# Patient Record
Sex: Female | Born: 1945 | Race: White | Hispanic: No | Smoking: Current some day smoker
Health system: Southern US, Community
[De-identification: ages and names within clinical notes are randomized; demographics above are authoritative.]

## PROBLEM LIST (undated history)

## (undated) ENCOUNTER — Inpatient Hospital Stay (HOSPITAL_COMMUNITY): Payer: Self-pay

## (undated) DIAGNOSIS — E039 Hypothyroidism, unspecified: Secondary | ICD-10-CM

## (undated) DIAGNOSIS — J189 Pneumonia, unspecified organism: Secondary | ICD-10-CM

## (undated) DIAGNOSIS — Z9889 Other specified postprocedural states: Secondary | ICD-10-CM

## (undated) DIAGNOSIS — T8859XA Other complications of anesthesia, initial encounter: Secondary | ICD-10-CM

## (undated) DIAGNOSIS — C801 Malignant (primary) neoplasm, unspecified: Secondary | ICD-10-CM

## (undated) DIAGNOSIS — R112 Nausea with vomiting, unspecified: Secondary | ICD-10-CM

## (undated) DIAGNOSIS — T4145XA Adverse effect of unspecified anesthetic, initial encounter: Secondary | ICD-10-CM

## (undated) HISTORY — PX: TONSILLECTOMY: SUR1361

---
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
## 2001-04-30 ENCOUNTER — Other Ambulatory Visit: Admission: RE | Admit: 2001-04-30 | Discharge: 2001-04-30 | Payer: Self-pay | Admitting: Family Medicine

## 2001-07-02 ENCOUNTER — Encounter: Admission: RE | Admit: 2001-07-02 | Discharge: 2001-07-02 | Payer: Self-pay | Admitting: Family Medicine

## 2001-07-02 ENCOUNTER — Encounter: Payer: Self-pay | Admitting: Family Medicine

## 2001-09-06 ENCOUNTER — Encounter (INDEPENDENT_AMBULATORY_CARE_PROVIDER_SITE_OTHER): Payer: Self-pay | Admitting: Specialist

## 2001-09-06 ENCOUNTER — Ambulatory Visit (HOSPITAL_COMMUNITY): Admission: RE | Admit: 2001-09-06 | Discharge: 2001-09-06 | Payer: Self-pay | Admitting: Gastroenterology

## 2002-05-12 ENCOUNTER — Other Ambulatory Visit: Admission: RE | Admit: 2002-05-12 | Discharge: 2002-05-12 | Payer: Self-pay | Admitting: Family Medicine

## 2002-07-07 ENCOUNTER — Encounter: Admission: RE | Admit: 2002-07-07 | Discharge: 2002-07-07 | Payer: Self-pay | Admitting: Family Medicine

## 2002-07-07 ENCOUNTER — Encounter: Payer: Self-pay | Admitting: Family Medicine

## 2003-07-09 ENCOUNTER — Encounter: Admission: RE | Admit: 2003-07-09 | Discharge: 2003-07-09 | Payer: Self-pay | Admitting: Internal Medicine

## 2003-11-06 ENCOUNTER — Encounter: Admission: RE | Admit: 2003-11-06 | Discharge: 2003-11-06 | Payer: Self-pay | Admitting: Internal Medicine

## 2004-04-15 ENCOUNTER — Encounter: Admission: RE | Admit: 2004-04-15 | Discharge: 2004-04-15 | Payer: Self-pay | Admitting: Internal Medicine

## 2004-07-13 ENCOUNTER — Encounter: Admission: RE | Admit: 2004-07-13 | Discharge: 2004-07-13 | Payer: Self-pay | Admitting: Internal Medicine

## 2004-09-08 ENCOUNTER — Ambulatory Visit (HOSPITAL_COMMUNITY): Admission: RE | Admit: 2004-09-08 | Discharge: 2004-09-08 | Payer: Self-pay | Admitting: Gastroenterology

## 2004-09-08 ENCOUNTER — Encounter (INDEPENDENT_AMBULATORY_CARE_PROVIDER_SITE_OTHER): Payer: Self-pay | Admitting: *Deleted

## 2005-07-25 ENCOUNTER — Encounter: Admission: RE | Admit: 2005-07-25 | Discharge: 2005-07-25 | Payer: Self-pay | Admitting: Family Medicine

## 2006-07-24 ENCOUNTER — Other Ambulatory Visit: Admission: RE | Admit: 2006-07-24 | Discharge: 2006-07-24 | Payer: Self-pay | Admitting: Family Medicine

## 2006-09-12 ENCOUNTER — Encounter: Admission: RE | Admit: 2006-09-12 | Discharge: 2006-09-12 | Payer: Self-pay | Admitting: Family Medicine

## 2007-09-13 ENCOUNTER — Encounter: Admission: RE | Admit: 2007-09-13 | Discharge: 2007-09-13 | Payer: Self-pay | Admitting: Family Medicine

## 2007-09-16 ENCOUNTER — Other Ambulatory Visit: Admission: RE | Admit: 2007-09-16 | Discharge: 2007-09-16 | Payer: Self-pay | Admitting: Obstetrics and Gynecology

## 2008-09-22 ENCOUNTER — Other Ambulatory Visit: Admission: RE | Admit: 2008-09-22 | Discharge: 2008-09-22 | Payer: Self-pay | Admitting: Obstetrics and Gynecology

## 2008-09-24 ENCOUNTER — Encounter: Admission: RE | Admit: 2008-09-24 | Discharge: 2008-09-24 | Payer: Self-pay | Admitting: Family Medicine

## 2009-09-27 ENCOUNTER — Encounter: Admission: RE | Admit: 2009-09-27 | Discharge: 2009-09-27 | Payer: Self-pay | Admitting: Obstetrics and Gynecology

## 2009-10-07 ENCOUNTER — Other Ambulatory Visit: Admission: RE | Admit: 2009-10-07 | Discharge: 2009-10-07 | Payer: Self-pay | Admitting: Obstetrics and Gynecology

## 2010-04-25 ENCOUNTER — Encounter: Payer: Self-pay | Admitting: Family Medicine

## 2010-08-19 NOTE — Op Note (Signed)
NAMEAMYLIA, Tammy Lawrence            ACCOUNT NO.:  [id number]   MEDICAL RECORD NO.:  [id number]          PATIENT TYPE:  AMB   LOCATION:  ENDO                         FACILITY:  [facility]   PHYSICIAN:  Bernette Redbird, M.D.   DATE OF BIRTH:  12/25/1945   DATE OF PROCEDURE:  09/08/2004  DATE OF DISCHARGE:                                 OPERATIVE REPORT   PROCEDURE:  Colonoscopy with biopsy.   INDICATIONS:  A 65 year old female whose son developed colon cancer some  years ago at age 39. The patient herself had one or two small colonic  adenomas removed colonoscopically about three years ago.   FINDINGS:  Two small polyps removed.   DESCRIPTION OF PROCEDURE:  The nature, purpose and risks of the procedure  were familiar to the patient from prior examination. She provided written  consent. Sedation was fentanyl 75 mcg and Versed 6 mg IV without arrhythmias  or desaturation. The Olympus adjustable tension pediatric video colonoscope  was advanced through a slightly fixated sigmoid region to the cecum, using  some external abdominal compression to enter the base of the cecum. The  cecum was identified by visualization of appendiceal orifice and  identification of the ileal cecal valve, although the terminal ileum was not  entered.   There was a tiny 2 mm bump on the appendiceal orifice, possibly representing  an early polyp, and a slightly larger small sessile polyp measuring about 3  mm across in the proximal ascending colon, removed by a single cold biopsy.  No other polyps were seen and there was no evidence of cancer, colitis,  vascular malformations or extensive diverticulosis; there did appear to be a  small amount of sigmoid diverticulosis.   Retroflexion in the rectum and reinspection of the rectum were unremarkable.   The patient tolerated the procedure well and there were no apparent  complications.   IMPRESSION:  1.  Two diminutive colon polyps removed as described above  (211.3).  2.  Mild diverticulosis.  3.  Family history of colon cancer.   PLAN:  Await pathology results with anticipated colonoscopic followup in  three to five 5 years depending on the histologic findings.       RB/MEDQ  D:  09/08/2004  T:  09/08/2004  Job:  [id number]   cc:   Allena Napoleon  [address]  [place]  [place] [address]  Fax: [phone]

## 2010-08-19 NOTE — Op Note (Signed)
[place] [employer]  Patient:    Tammy Lawrence, [facility] Visit Number: [id number] MRN: [id number]          Service Type: END Location: ENDO Attending Physician:  Rich Brave Dictated by:   Florencia Reasons, M.D. Proc. Date: 09/06/01 Admit Date:  09/06/2001 Discharge Date: 09/06/2001   CC:         Montey Hora, M.D.   Operative Report  PROCEDURE PERFORMED:  Colonoscopy with biopsy.  ENDOSCOPIST:  Florencia Reasons, M.D.  INDICATIONS FOR PROCEDURE:  The patient is a 65 year old female with a family history of colon cancer in her son, who was diagnosed around age 38.  FINDINGS:  Two diminutive polyps.  DESCRIPTION OF PROCEDURE:  The nature, purpose and risks of the procedure had been discussed with the patient, who provided written consent.  Sedation was fentanyl 70 mcg and Versed 7 mg IV without arrhythmias or desaturation. The Olympus standard pediatric video colonoscope was advanced quite to the cecum, having to turn the patient into the supine, right lateral decubitus and ultimately left lateral decubitus position with external abdominal compression to get the tip of the scope into the base of the cecum, which was successfully accomplished and pullback was then performed.  The quality of the prep was excellent and it is felt that all areas were well seen.  There were two small (2 to 3 mm) sessile polyps in the descending colon, each removed by  one or two cold biopsies.  No large polyps, cancer, colitis, vascular malformations or diverticular disease were observed.  Retroflexion in the rectum as well as reinspection of the rectosigmoid was unremarkable.  The patient tolerated the procedure well and there were no apparent complications.  IMPRESSION:  Two small colon polyps removed as described above.  Otherwise normal exam.  PLAN:  Await pathology on polyps. Dictated by:   Florencia Reasons, M.D. Attending Physician:  Rich Brave DD:  09/06/01 TD:  09/09/01 Job: [id number]

## 2010-09-05 ENCOUNTER — Other Ambulatory Visit: Payer: Self-pay | Admitting: Family Medicine

## 2010-09-05 DIAGNOSIS — Z1231 Encounter for screening mammogram for malignant neoplasm of breast: Secondary | ICD-10-CM

## 2010-09-29 ENCOUNTER — Ambulatory Visit
Admission: RE | Admit: 2010-09-29 | Discharge: 2010-09-29 | Disposition: A | Discharge status: Home / Self Care | Payer: BC Managed Care – PPO | Source: Ambulatory Visit | Attending: Family Medicine | Admitting: Family Medicine

## 2010-09-29 DIAGNOSIS — Z1231 Encounter for screening mammogram for malignant neoplasm of breast: Secondary | ICD-10-CM

## 2010-12-08 ENCOUNTER — Other Ambulatory Visit (HOSPITAL_COMMUNITY)
Admission: RE | Admit: 2010-12-08 | Discharge: 2010-12-08 | Disposition: A | Discharge status: Home / Self Care | Payer: BC Managed Care – PPO | Source: Ambulatory Visit | Attending: Family Medicine | Admitting: Family Medicine

## 2010-12-08 ENCOUNTER — Other Ambulatory Visit: Payer: Self-pay | Admitting: Family Medicine

## 2010-12-08 DIAGNOSIS — Z124 Encounter for screening for malignant neoplasm of cervix: Secondary | ICD-10-CM | POA: Insufficient documentation

## 2010-12-08 DIAGNOSIS — R87810 Cervical high risk human papillomavirus (HPV) DNA test positive: Secondary | ICD-10-CM | POA: Insufficient documentation

## 2011-08-23 ENCOUNTER — Other Ambulatory Visit: Payer: Self-pay | Admitting: Family Medicine

## 2011-08-23 DIAGNOSIS — Z1231 Encounter for screening mammogram for malignant neoplasm of breast: Secondary | ICD-10-CM

## 2011-10-09 ENCOUNTER — Ambulatory Visit
Admission: RE | Admit: 2011-10-09 | Discharge: 2011-10-09 | Disposition: A | Discharge status: Home / Self Care | Payer: BC Managed Care – PPO | Source: Ambulatory Visit | Attending: Family Medicine | Admitting: Family Medicine

## 2011-10-09 DIAGNOSIS — Z1231 Encounter for screening mammogram for malignant neoplasm of breast: Secondary | ICD-10-CM

## 2011-12-11 ENCOUNTER — Other Ambulatory Visit: Payer: Self-pay | Admitting: Obstetrics and Gynecology

## 2011-12-11 ENCOUNTER — Other Ambulatory Visit (HOSPITAL_COMMUNITY)
Admission: RE | Admit: 2011-12-11 | Discharge: 2011-12-11 | Disposition: A | Discharge status: Home / Self Care | Payer: Medicare Other | Source: Ambulatory Visit | Attending: Obstetrics and Gynecology | Admitting: Obstetrics and Gynecology

## 2011-12-11 DIAGNOSIS — Z01419 Encounter for gynecological examination (general) (routine) without abnormal findings: Secondary | ICD-10-CM | POA: Insufficient documentation

## 2012-09-27 ENCOUNTER — Other Ambulatory Visit: Payer: Self-pay

## 2012-09-27 DIAGNOSIS — Z1231 Encounter for screening mammogram for malignant neoplasm of breast: Secondary | ICD-10-CM

## 2012-10-10 ENCOUNTER — Ambulatory Visit
Admission: RE | Admit: 2012-10-10 | Discharge: 2012-10-10 | Disposition: A | Discharge status: Home / Self Care | Payer: Medicare PPO | Source: Ambulatory Visit

## 2012-10-10 DIAGNOSIS — Z1231 Encounter for screening mammogram for malignant neoplasm of breast: Secondary | ICD-10-CM

## 2012-10-17 ENCOUNTER — Ambulatory Visit: Payer: BC Managed Care – PPO

## 2012-12-11 ENCOUNTER — Other Ambulatory Visit: Payer: Self-pay | Admitting: Obstetrics and Gynecology

## 2012-12-11 ENCOUNTER — Other Ambulatory Visit (HOSPITAL_COMMUNITY)
Admission: RE | Admit: 2012-12-11 | Discharge: 2012-12-11 | Disposition: A | Discharge status: Home / Self Care | Payer: Medicare PPO | Source: Ambulatory Visit | Attending: Obstetrics and Gynecology | Admitting: Obstetrics and Gynecology

## 2012-12-11 DIAGNOSIS — R87619 Unspecified abnormal cytological findings in specimens from cervix uteri: Secondary | ICD-10-CM | POA: Insufficient documentation

## 2012-12-11 DIAGNOSIS — R87810 Cervical high risk human papillomavirus (HPV) DNA test positive: Secondary | ICD-10-CM | POA: Insufficient documentation

## 2012-12-11 DIAGNOSIS — Z01419 Encounter for gynecological examination (general) (routine) without abnormal findings: Secondary | ICD-10-CM | POA: Insufficient documentation

## 2013-04-30 ENCOUNTER — Other Ambulatory Visit: Payer: Self-pay | Admitting: Gastroenterology

## 2013-04-30 DIAGNOSIS — R740 Nonspecific elevation of levels of transaminase and lactic acid dehydrogenase [LDH]: Principal | ICD-10-CM

## 2013-04-30 DIAGNOSIS — R7401 Elevation of levels of liver transaminase levels: Secondary | ICD-10-CM

## 2013-05-08 ENCOUNTER — Other Ambulatory Visit: Payer: Medicare PPO

## 2013-05-13 ENCOUNTER — Ambulatory Visit
Admission: RE | Admit: 2013-05-13 | Discharge: 2013-05-13 | Disposition: A | Discharge status: Home / Self Care | Payer: Medicare PPO | Source: Ambulatory Visit | Attending: Gastroenterology | Admitting: Gastroenterology

## 2013-05-13 DIAGNOSIS — R7402 Elevation of levels of lactic acid dehydrogenase (LDH): Secondary | ICD-10-CM

## 2013-05-13 DIAGNOSIS — R740 Nonspecific elevation of levels of transaminase and lactic acid dehydrogenase [LDH]: Principal | ICD-10-CM

## 2013-09-16 ENCOUNTER — Other Ambulatory Visit: Payer: Self-pay

## 2013-09-16 DIAGNOSIS — Z1231 Encounter for screening mammogram for malignant neoplasm of breast: Secondary | ICD-10-CM

## 2013-09-26 ENCOUNTER — Non-Acute Institutional Stay: Payer: Medicare PPO | Admitting: Internal Medicine

## 2013-09-26 DIAGNOSIS — E1149 Type 2 diabetes mellitus with other diabetic neurological complication: Secondary | ICD-10-CM

## 2013-09-26 DIAGNOSIS — G609 Hereditary and idiopathic neuropathy, unspecified: Secondary | ICD-10-CM

## 2013-09-26 DIAGNOSIS — I509 Heart failure, unspecified: Secondary | ICD-10-CM

## 2013-09-26 DIAGNOSIS — I10 Essential (primary) hypertension: Secondary | ICD-10-CM

## 2013-10-09 DIAGNOSIS — G609 Hereditary and idiopathic neuropathy, unspecified: Secondary | ICD-10-CM | POA: Insufficient documentation

## 2013-10-09 DIAGNOSIS — I509 Heart failure, unspecified: Secondary | ICD-10-CM | POA: Insufficient documentation

## 2013-10-09 DIAGNOSIS — I10 Essential (primary) hypertension: Secondary | ICD-10-CM | POA: Insufficient documentation

## 2013-10-09 DIAGNOSIS — E1149 Type 2 diabetes mellitus with other diabetic neurological complication: Secondary | ICD-10-CM | POA: Insufficient documentation

## 2013-10-09 NOTE — Progress Notes (Signed)
Patient ID: Tammy Lawrence, female   DOB: [birth year], 68 y.o.   MRN: [id number]                 HISTORY & PHYSICAL  DATE: 09/26/2013          FACILITY: [facility]  LEVEL OF CARE: SNF (31)  ALLERGIES:   Penicillins.    CHIEF COMPLAINT:  Manage diabetes mellitus, CHF, and hypertension.    HISTORY OF PRESENT ILLNESS:  68 year-old, African-American female admitted to this facility for short-term rehabilitation.    REASSESSMENT OF ONGOING PROBLEM(S):    DM:pt's DM remains stable.  Pt denies polyuria, polydipsia, polyphagia, changes in vision or hypoglycemic episodes.  Last hemoglobin A1c is:   Not available.    CHF:The patient does not relate significant weight changes, denies sob, DOE, orthopnea, PNDs, palpitations or chest pain.  Complains of  chronic lower extremity swelling.  CHF remains stable.  No complications form the medications being used.    HTN: Pt 's HTN remains stable.  Denies CP, sob, DOE, headaches, dizziness or visual disturbances.  No complications from the medications currently being used.  Last BP :   100/76.    PAST MEDICAL HISTORY :   Diabetes mellitus.    Hypertension.    Lymphedema.    Morbid obesity.    Hyperlipidemia.    Peripheral neuropathy.    CHF.    Sarcoidosis.    Gout.    Anemia.    PAST SURGICAL HISTORY:  Rotator cuff repair.    Right temporal lobe biopsy.    Right knee arthroscopy.     SOCIAL HISTORY: MARITAL HISTORY:  Patient is a widow.   TOBACCO USE:  She is a former smoker of one pack a day for 30 years, quit in 1985.   ALCOHOL:  Denies alcohol use.    ILLICIT DRUGS:  Denies illicit drug use.    FAMILY HISTORY:  MOTHER:  Mother had mental illness.   FATHER:  Father had COPD.   SIBLINGS:  Sister has diabetes.  Brother has cancer.   Brother also has CHF, diabetes mellitus, and hypertension.    CURRENT MEDICATIONS: Reviewed per MAR/see medication list  REVIEW OF SYSTEMS:  See HPI otherwise 14  point ROS is negative.  PHYSICAL EXAMINATION  VS:  T 98       P 78      RR 18      BP 100/76             GENERAL: no acute distress, morbidly obese body habitus EYES: conjunctivae normal, sclerae normal, normal eye lids MOUTH/THROAT: lips without lesions,no lesions in the mouth,tongue is without lesions,uvula elevates in midline NECK: supple, trachea midline, no neck masses, no thyroid tenderness, no thyromegaly LYMPHATICS: no LAN in the neck, no supraclavicular LAN RESPIRATORY: breathing is even & unlabored, BS CTAB CARDIAC: RRR, no murmur,no extra heart sounds, +3 bilateral lower extremity edema    GI:  ABDOMEN: abdomen soft, normal BS, no masses, no tenderness  LIVER/SPLEEN: no hepatomegaly, no splenomegaly MUSCULOSKELETAL: HEAD: normal to inspection  EXTREMITIES: LEFT UPPER EXTREMITY: full range of motion, normal strength & tone RIGHT UPPER EXTREMITY:  full range of motion, normal strength & tone LEFT LOWER EXTREMITY: strength decreased, range of motion minimal   RIGHT LOWER EXTREMITY: strength decreased, range of motion minimal   PSYCHIATRIC: the patient is alert & oriented to person, affect & behavior appropriate  LABS/RADIOLOGY:  On 09/25/2013:    PT 12.3,  INR 1.2.    CO2 20, BUN 25, glucose 142, otherwise BMP normal.    Magnesium 1.8.    On 09/24/2013:    WBC 11, hemoglobin 8, MCV 92.6, platelets 275.     ASSESSMENT/PLAN:   Diabetes mellitus.  Currently diet controlled.    CHF.  Compensated.    Hypertension.  Well controlled.     Peripheral neuropathy.  Continue Neurontin.     Lymphedema.  Continue supportive care.     Hyperlipidemia.  Continue Lipitor.    Constipation.  Continue MiraLAX.    Check CBC and BMP.    I have reviewed patient's medical records received at admission/from hospitalization.  CPT CODE: 81829           Gayani Y Dasanayaka, [place] [phone]

## 2013-10-09 NOTE — Progress Notes (Signed)
This encounter was created in error - please disregard.

## 2013-10-09 NOTE — Addendum Note (Signed)
Addended by: Merlene Laughter on: 10/09/2013 04:26 PM   Modules accepted: Level of Service, SmartSet

## 2013-10-14 ENCOUNTER — Ambulatory Visit
Admission: RE | Admit: 2013-10-14 | Discharge: 2013-10-14 | Disposition: A | Discharge status: Home / Self Care | Payer: Medicare PPO | Source: Ambulatory Visit

## 2013-10-14 DIAGNOSIS — Z1231 Encounter for screening mammogram for malignant neoplasm of breast: Secondary | ICD-10-CM

## 2013-11-19 ENCOUNTER — Non-Acute Institutional Stay: Payer: Medicare PPO | Admitting: Internal Medicine

## 2013-11-19 DIAGNOSIS — Z79899 Other long term (current) drug therapy: Secondary | ICD-10-CM

## 2013-11-19 DIAGNOSIS — D631 Anemia in chronic kidney disease: Secondary | ICD-10-CM

## 2013-11-19 DIAGNOSIS — N039 Chronic nephritic syndrome with unspecified morphologic changes: Secondary | ICD-10-CM

## 2013-11-19 DIAGNOSIS — R609 Edema, unspecified: Secondary | ICD-10-CM

## 2013-11-24 ENCOUNTER — Non-Acute Institutional Stay: Payer: Medicare PPO | Admitting: Internal Medicine

## 2013-11-24 DIAGNOSIS — N183 Chronic kidney disease, stage 3 unspecified: Secondary | ICD-10-CM

## 2013-11-24 DIAGNOSIS — D631 Anemia in chronic kidney disease: Secondary | ICD-10-CM | POA: Insufficient documentation

## 2013-11-24 DIAGNOSIS — Z79899 Other long term (current) drug therapy: Secondary | ICD-10-CM | POA: Insufficient documentation

## 2013-11-24 DIAGNOSIS — N039 Chronic nephritic syndrome with unspecified morphologic changes: Secondary | ICD-10-CM

## 2013-11-24 NOTE — Progress Notes (Signed)
Patient ID: Tammy Lawrence, female   DOB: 1946-03-03, 68 y.o.   MRN: [id number]           PROGRESS NOTE  DATE: 11/19/2013          FACILITY:  [facility]  LEVEL OF CARE: SNF (31)  Acute Visit  CHIEF COMPLAINT:  Manage increasing lower extremity swelling and anemia of chronic kidney disease.    HISTORY OF PRESENT ILLNESS: I was requested by the staff to assess the patient regarding above problem(s):  EDEMA: The patient's edema is unstable. Patient is complaining of increased lower extremity swelling, especially in the left lower extremity, with skin breakdown in her left great toe and drainage. The patient denies calf pain, chest pain or shortness of breath. No complications reported from the medications currently being used.  She has chronic lymphedema.  She is currently not on any diuretics.     ANEMIA: The anemia has been stable. The patient denies fatigue, melena or hematochezia. No complications from the medications currently being used.  On 11/18/2013:  Hemoglobin 8.1, MCV 91.  On 11/13/2013:  Hemoglobin 8.2.  The patient's edema is secondary to chronic kidney disease.    PAST MEDICAL HISTORY : Reviewed.  No changes/see problem list  CURRENT MEDICATIONS: Reviewed per MAR/see medication list  REVIEW OF SYSTEMS:  GENERAL: no change in appetite, no fatigue, no weight changes, no fever, chills or weakness RESPIRATORY: no cough, SOB, DOE,, wheezing, hemoptysis CARDIAC: no chest pain or palpitations;  chronic lower extremity swelling, complains of increasing left lower extremity swelling        GI: no abdominal pain, diarrhea, constipation, heart burn, nausea or vomiting  PHYSICAL EXAMINATION  VS: see VS section  GENERAL: no acute distress, morbidly obese body habitus NECK: supple, trachea midline, no neck masses, no thyroid tenderness, no thyromegaly RESPIRATORY: breathing is even & unlabored, BS CTAB CARDIAC: RRR, no murmur,no extra heart sounds, +3  left lower extremity edema, +2 right lower extremity edema        GI: abdomen soft, normal BS, no masses, no tenderness, no hepatomegaly, no splenomegaly PSYCHIATRIC: the patient is alert & oriented to person, affect & behavior appropriate  LABS/RADIOLOGY:  11/18/2013:  BMP normal.    ASSESSMENT/PLAN:  Increasing left lower extremity edema.  New problem.  Start Lasix 20 mg q.d.  Also, start KCl 10 mEq q.d.    Anemia of chronic kidney disease.  Stable.    V58.69.  Check BMP in two days.    CPT CODE: 37106            Chole Driver Y Amita Atayde, [place] [phone]

## 2013-11-25 DIAGNOSIS — N183 Chronic kidney disease, stage 3 unspecified: Secondary | ICD-10-CM | POA: Insufficient documentation

## 2013-11-25 NOTE — Addendum Note (Signed)
Addended by: Merlene Laughter on: 11/25/2013 08:01 PM   Modules accepted: Level of Service, SmartSet

## 2013-11-25 NOTE — Progress Notes (Signed)
This encounter was created in error - please disregard.

## 2013-11-25 NOTE — Addendum Note (Signed)
Addended by: Merlene Laughter on: 11/25/2013 07:57 PM   Modules accepted: Level of Service, SmartSet

## 2013-11-25 NOTE — Progress Notes (Signed)
Patient ID: Tammy Lawrence, female   DOB: Feb 13, 1946, 68 y.o.   MRN: [id number]           PROGRESS NOTE  DATE: 11/24/2013         FACILITY:  [facility]  LEVEL OF CARE: SNF (31)  Acute Visit  CHIEF COMPLAINT:  Manage chronic kidney disease stage III.    HISTORY OF PRESENT ILLNESS: I was requested by the staff to assess the patient regarding above problem(s):  CHRONIC KIDNEY DISEASE: The patient's chronic kidney disease is unstable.  Patient denies increasing lower extremity swelling or confusion. Last BUN and creatinine are:   On 11/21/2013:  BUN 19, creatinine 1.16.  In 10/2013:  Creatinine 0.98.  She has a history of chronic kidney disease stage III.  Patient was started on Lasix on 11/19/2013.    PAST MEDICAL HISTORY : Reviewed.  No changes/see problem list  CURRENT MEDICATIONS: Reviewed per MAR/see medication list  REVIEW OF SYSTEMS:  GENERAL: no change in appetite, no fatigue, no weight changes, no fever, chills or weakness RESPIRATORY: no cough, SOB, DOE,, wheezing, hemoptysis CARDIAC: no chest pain or palpitations;  chronic lower extremity swelling       GI: no abdominal pain, diarrhea, constipation, heart burn, nausea or vomiting  PHYSICAL EXAMINATION  VS: see VS section  GENERAL: no acute distress, morbidly obese body habitus NECK: supple, trachea midline, no neck masses, no thyroid tenderness, no thyromegaly RESPIRATORY: breathing is even & unlabored, BS CTAB CARDIAC: RRR, no murmur,no extra heart sounds, left lower extremity has +3 edema, right lower extremity has +2 edema        GI: abdomen soft, normal BS, no masses, no tenderness, no hepatomegaly, no splenomegaly PSYCHIATRIC: the patient is alert & oriented to person, affect & behavior appropriate  ASSESSMENT/PLAN:  Chronic kidney disease stage III.  Unstable problem.  Renal functions are worsening, likely secondary to Lasix.    We will recheck.    CPT CODE: 33825           Amrie Gurganus Y  Gavyn Ybarra, [place] [phone]

## 2013-12-23 ENCOUNTER — Other Ambulatory Visit: Payer: Self-pay | Admitting: Obstetrics and Gynecology

## 2013-12-23 ENCOUNTER — Other Ambulatory Visit (HOSPITAL_COMMUNITY)
Admission: RE | Admit: 2013-12-23 | Discharge: 2013-12-23 | Disposition: A | Discharge status: Home / Self Care | Payer: Medicare PPO | Source: Ambulatory Visit | Attending: Obstetrics and Gynecology | Admitting: Obstetrics and Gynecology

## 2013-12-23 DIAGNOSIS — Z124 Encounter for screening for malignant neoplasm of cervix: Secondary | ICD-10-CM | POA: Insufficient documentation

## 2013-12-23 DIAGNOSIS — Z1151 Encounter for screening for human papillomavirus (HPV): Secondary | ICD-10-CM | POA: Insufficient documentation

## 2013-12-23 DIAGNOSIS — R87810 Cervical high risk human papillomavirus (HPV) DNA test positive: Secondary | ICD-10-CM | POA: Diagnosis present

## 2013-12-25 LAB — CYTOLOGY - PAP

## 2014-02-23 ENCOUNTER — Other Ambulatory Visit: Payer: Self-pay | Admitting: Obstetrics and Gynecology

## 2014-09-18 DIAGNOSIS — J069 Acute upper respiratory infection, unspecified: Secondary | ICD-10-CM | POA: Diagnosis not present

## 2014-09-21 ENCOUNTER — Other Ambulatory Visit: Payer: Self-pay

## 2014-09-21 DIAGNOSIS — Z1231 Encounter for screening mammogram for malignant neoplasm of breast: Secondary | ICD-10-CM

## 2014-10-22 ENCOUNTER — Ambulatory Visit
Admission: RE | Admit: 2014-10-22 | Discharge: 2014-10-22 | Disposition: A | Discharge status: Home / Self Care | Payer: Medicare PPO | Source: Ambulatory Visit

## 2014-10-22 DIAGNOSIS — Z1231 Encounter for screening mammogram for malignant neoplasm of breast: Secondary | ICD-10-CM

## 2014-12-29 ENCOUNTER — Other Ambulatory Visit (HOSPITAL_COMMUNITY)
Admission: RE | Admit: 2014-12-29 | Discharge: 2014-12-29 | Disposition: A | Discharge status: Home / Self Care | Payer: Medicare PPO | Source: Ambulatory Visit | Attending: Obstetrics and Gynecology | Admitting: Obstetrics and Gynecology

## 2014-12-29 ENCOUNTER — Other Ambulatory Visit: Payer: Self-pay | Admitting: Obstetrics and Gynecology

## 2014-12-29 DIAGNOSIS — M8580 Other specified disorders of bone density and structure, unspecified site: Secondary | ICD-10-CM | POA: Diagnosis not present

## 2014-12-29 DIAGNOSIS — Z1151 Encounter for screening for human papillomavirus (HPV): Secondary | ICD-10-CM | POA: Diagnosis not present

## 2014-12-29 DIAGNOSIS — Z01419 Encounter for gynecological examination (general) (routine) without abnormal findings: Secondary | ICD-10-CM | POA: Diagnosis not present

## 2014-12-29 DIAGNOSIS — Z124 Encounter for screening for malignant neoplasm of cervix: Secondary | ICD-10-CM | POA: Insufficient documentation

## 2014-12-29 DIAGNOSIS — Z01411 Encounter for gynecological examination (general) (routine) with abnormal findings: Secondary | ICD-10-CM | POA: Diagnosis not present

## 2014-12-29 DIAGNOSIS — R87810 Cervical high risk human papillomavirus (HPV) DNA test positive: Secondary | ICD-10-CM | POA: Diagnosis not present

## 2014-12-30 LAB — CYTOLOGY - PAP

## 2015-01-12 DIAGNOSIS — E785 Hyperlipidemia, unspecified: Secondary | ICD-10-CM | POA: Diagnosis not present

## 2015-01-12 DIAGNOSIS — F17210 Nicotine dependence, cigarettes, uncomplicated: Secondary | ICD-10-CM | POA: Diagnosis not present

## 2015-01-12 DIAGNOSIS — M25569 Pain in unspecified knee: Secondary | ICD-10-CM | POA: Diagnosis not present

## 2015-01-12 DIAGNOSIS — E559 Vitamin D deficiency, unspecified: Secondary | ICD-10-CM | POA: Diagnosis not present

## 2015-01-12 DIAGNOSIS — R232 Flushing: Secondary | ICD-10-CM | POA: Diagnosis not present

## 2015-01-12 DIAGNOSIS — Z79899 Other long term (current) drug therapy: Secondary | ICD-10-CM | POA: Diagnosis not present

## 2015-01-12 DIAGNOSIS — Z0000 Encounter for general adult medical examination without abnormal findings: Secondary | ICD-10-CM | POA: Diagnosis not present

## 2015-01-12 DIAGNOSIS — Z23 Encounter for immunization: Secondary | ICD-10-CM | POA: Diagnosis not present

## 2015-01-12 DIAGNOSIS — J309 Allergic rhinitis, unspecified: Secondary | ICD-10-CM | POA: Diagnosis not present

## 2015-02-01 ENCOUNTER — Telehealth: Payer: Self-pay | Admitting: Acute Care

## 2015-02-01 NOTE — Telephone Encounter (Signed)
Left Message to make Appointment with Judson Roch

## 2015-02-03 NOTE — Telephone Encounter (Signed)
Pt returned call, pt states she is not a heavy smoker and does not want to participate in the program. Pt states she will call Dr.Koirala and explain why she does not want to participate. She was appreciative of the understanding of the this program and happy to know that it is here if she needs it.

## 2015-02-05 ENCOUNTER — Telehealth: Payer: Self-pay | Admitting: Acute Care

## 2015-02-05 NOTE — Telephone Encounter (Signed)
I called Tammy Lawrence at [place] [employer] to let them know that this patient has opted not to participate in the screening program. She does not feel she is a heavy enough smoker to participate even though she does meet the criteria for the program. [name] verbalized understanding and will document this in the patient's record.

## 2015-03-26 DIAGNOSIS — L918 Other hypertrophic disorders of the skin: Secondary | ICD-10-CM | POA: Diagnosis not present

## 2015-10-19 ENCOUNTER — Other Ambulatory Visit: Payer: Self-pay | Admitting: Obstetrics and Gynecology

## 2015-10-19 DIAGNOSIS — Z1231 Encounter for screening mammogram for malignant neoplasm of breast: Secondary | ICD-10-CM

## 2015-11-01 ENCOUNTER — Ambulatory Visit
Admission: RE | Admit: 2015-11-01 | Discharge: 2015-11-01 | Disposition: A | Discharge status: Home / Self Care | Payer: Medicare Other | Source: Ambulatory Visit | Attending: Obstetrics and Gynecology | Admitting: Obstetrics and Gynecology

## 2015-11-01 DIAGNOSIS — Z1231 Encounter for screening mammogram for malignant neoplasm of breast: Secondary | ICD-10-CM

## 2016-02-10 ENCOUNTER — Other Ambulatory Visit: Payer: Self-pay | Admitting: Obstetrics and Gynecology

## 2016-02-10 ENCOUNTER — Other Ambulatory Visit (HOSPITAL_COMMUNITY)
Admission: RE | Admit: 2016-02-10 | Discharge: 2016-02-10 | Disposition: A | Discharge status: Home / Self Care | Payer: Medicare Other | Source: Ambulatory Visit | Attending: Obstetrics and Gynecology | Admitting: Obstetrics and Gynecology

## 2016-02-10 DIAGNOSIS — Z01419 Encounter for gynecological examination (general) (routine) without abnormal findings: Secondary | ICD-10-CM | POA: Insufficient documentation

## 2016-02-10 DIAGNOSIS — Z1151 Encounter for screening for human papillomavirus (HPV): Secondary | ICD-10-CM | POA: Diagnosis present

## 2016-02-14 LAB — CYTOLOGY - PAP
Diagnosis: NEGATIVE
HPV: NOT DETECTED

## 2016-11-07 ENCOUNTER — Other Ambulatory Visit: Payer: Self-pay | Admitting: Obstetrics and Gynecology

## 2016-11-07 DIAGNOSIS — Z1231 Encounter for screening mammogram for malignant neoplasm of breast: Secondary | ICD-10-CM

## 2016-11-16 ENCOUNTER — Ambulatory Visit
Admission: RE | Admit: 2016-11-16 | Discharge: 2016-11-16 | Disposition: A | Discharge status: Home / Self Care | Payer: Medicare Other | Source: Ambulatory Visit | Attending: Obstetrics and Gynecology | Admitting: Obstetrics and Gynecology

## 2016-11-16 DIAGNOSIS — Z1231 Encounter for screening mammogram for malignant neoplasm of breast: Secondary | ICD-10-CM

## 2017-10-25 ENCOUNTER — Other Ambulatory Visit: Payer: Self-pay | Admitting: Obstetrics and Gynecology

## 2017-10-25 DIAGNOSIS — Z1231 Encounter for screening mammogram for malignant neoplasm of breast: Secondary | ICD-10-CM

## 2017-11-20 ENCOUNTER — Ambulatory Visit
Admission: RE | Admit: 2017-11-20 | Discharge: 2017-11-20 | Disposition: A | Discharge status: Home / Self Care | Payer: Medicare Other | Source: Ambulatory Visit | Attending: Obstetrics and Gynecology | Admitting: Obstetrics and Gynecology

## 2017-11-20 DIAGNOSIS — Z1231 Encounter for screening mammogram for malignant neoplasm of breast: Secondary | ICD-10-CM

## 2018-02-22 ENCOUNTER — Other Ambulatory Visit: Payer: Self-pay | Admitting: Family Medicine

## 2018-02-22 DIAGNOSIS — Z87891 Personal history of nicotine dependence: Secondary | ICD-10-CM

## 2018-02-22 DIAGNOSIS — F17210 Nicotine dependence, cigarettes, uncomplicated: Secondary | ICD-10-CM

## 2018-02-26 ENCOUNTER — Ambulatory Visit
Admission: RE | Admit: 2018-02-26 | Discharge: 2018-02-26 | Disposition: A | Discharge status: Home / Self Care | Payer: Medicare Other | Source: Ambulatory Visit | Attending: Family Medicine | Admitting: Family Medicine

## 2018-02-26 DIAGNOSIS — F17210 Nicotine dependence, cigarettes, uncomplicated: Secondary | ICD-10-CM

## 2018-02-26 DIAGNOSIS — Z87891 Personal history of nicotine dependence: Secondary | ICD-10-CM

## 2018-03-06 ENCOUNTER — Other Ambulatory Visit: Payer: Self-pay | Admitting: Family Medicine

## 2018-03-06 DIAGNOSIS — E041 Nontoxic single thyroid nodule: Secondary | ICD-10-CM

## 2018-03-11 ENCOUNTER — Ambulatory Visit
Admission: RE | Admit: 2018-03-11 | Discharge: 2018-03-11 | Disposition: A | Discharge status: Home / Self Care | Payer: Medicare Other | Source: Ambulatory Visit | Attending: Family Medicine | Admitting: Family Medicine

## 2018-03-11 DIAGNOSIS — E041 Nontoxic single thyroid nodule: Secondary | ICD-10-CM

## 2018-03-18 ENCOUNTER — Other Ambulatory Visit: Payer: Self-pay | Admitting: Family Medicine

## 2018-03-18 DIAGNOSIS — E041 Nontoxic single thyroid nodule: Secondary | ICD-10-CM

## 2018-03-19 ENCOUNTER — Other Ambulatory Visit (HOSPITAL_COMMUNITY)
Admission: RE | Admit: 2018-03-19 | Discharge: 2018-03-19 | Disposition: A | Discharge status: Home / Self Care | Payer: Medicare Other | Source: Ambulatory Visit | Attending: Interventional Radiology | Admitting: Interventional Radiology

## 2018-03-19 ENCOUNTER — Ambulatory Visit
Admission: RE | Admit: 2018-03-19 | Discharge: 2018-03-19 | Disposition: A | Discharge status: Home / Self Care | Payer: Medicare Other | Source: Ambulatory Visit | Attending: Family Medicine | Admitting: Family Medicine

## 2018-03-19 DIAGNOSIS — E041 Nontoxic single thyroid nodule: Secondary | ICD-10-CM | POA: Diagnosis present

## 2018-03-19 NOTE — Procedures (Signed)
PROCEDURE SUMMARY:  Using direct ultrasound guidance, 4 passes were made using 25 g needles into the nodule within the right inferior lobe of the thyroid.   Ultrasound was used to confirm needle placements on all occasions.   EBL = trace  Specimens were sent to Pathology for analysis.  See procedure note under Imaging tab in Epic for full procedure details.  Claudette Wermuth S Rondel Episcopo PA-C 03/19/2018 2:08 PM

## 2018-06-18 ENCOUNTER — Other Ambulatory Visit: Payer: Self-pay | Admitting: Internal Medicine

## 2018-06-18 DIAGNOSIS — E042 Nontoxic multinodular goiter: Secondary | ICD-10-CM

## 2018-11-07 ENCOUNTER — Other Ambulatory Visit: Payer: Self-pay | Admitting: Family Medicine

## 2018-11-07 DIAGNOSIS — Z1231 Encounter for screening mammogram for malignant neoplasm of breast: Secondary | ICD-10-CM

## 2018-12-20 ENCOUNTER — Other Ambulatory Visit: Payer: Self-pay

## 2018-12-20 ENCOUNTER — Ambulatory Visit
Admission: RE | Admit: 2018-12-20 | Discharge: 2018-12-20 | Disposition: A | Discharge status: Home / Self Care | Payer: Medicare Other | Source: Ambulatory Visit | Attending: Family Medicine | Admitting: Family Medicine

## 2018-12-20 DIAGNOSIS — Z1231 Encounter for screening mammogram for malignant neoplasm of breast: Secondary | ICD-10-CM

## 2019-01-07 ENCOUNTER — Ambulatory Visit
Admission: RE | Admit: 2019-01-07 | Discharge: 2019-01-07 | Disposition: A | Discharge status: Home / Self Care | Payer: Medicare Other | Source: Ambulatory Visit | Attending: Internal Medicine | Admitting: Internal Medicine

## 2019-01-07 DIAGNOSIS — E042 Nontoxic multinodular goiter: Secondary | ICD-10-CM

## 2019-02-04 ENCOUNTER — Encounter (HOSPITAL_COMMUNITY): Payer: Self-pay

## 2019-03-14 ENCOUNTER — Other Ambulatory Visit: Payer: Self-pay | Admitting: Family Medicine

## 2019-03-14 DIAGNOSIS — E2839 Other primary ovarian failure: Secondary | ICD-10-CM

## 2019-03-20 ENCOUNTER — Ambulatory Visit: Payer: Self-pay | Admitting: Surgery

## 2019-04-02 NOTE — Patient Instructions (Addendum)
DUE TO COVID-19 ONLY ONE VISITOR IS ALLOWED TO COME WITH YOU AND STAY IN THE WAITING ROOM ONLY DURING PRE OP AND PROCEDURE DAY OF SURGERY. THE 1 VISITOR MAY VISIT WITH YOU AFTER SURGERY IN YOUR PRIVATE ROOM DURING VISITING HOURS ONLY!  YOU NEED TO HAVE A COVID 19 TEST ON 1:7-20_ @2 :00 PM_, THIS TEST MUST BE DONE BEFORE SURGERY, COME  [address] , [id number].  ([address]) ONCE YOUR COVID TEST IS COMPLETED, PLEASE BEGIN THE QUARANTINE INSTRUCTIONS AS OUTLINED IN YOUR HANDOUT.                Tammy Lawrence  04/02/2019   Your procedure is scheduled on: 04-13-18   Report to [facility] Main  Entrance    Report to Admitting at 12:00 PM     Call this number if you have problems the morning of surgery [phone]    Remember: NO SOLID FOOD AFTER MIDNIGHT THE NIGHT PRIOR TO SURGERY. NOTHING BY MOUTH EXCEPT CLEAR LIQUIDS UNTIL 8:00 AM. AFTER 8:00 AM, NOTHING UNTIL AFTER SURGERY  CLEAR LIQUID DIET   Foods Allowed                                                                     Foods Excluded  Coffee and tea, regular and decaf                             liquids that you cannot  Plain Jell-O any favor except red or purple                                           see through such as: Fruit ices (not with fruit pulp)                                     milk, soups, orange juice  Iced Popsicles                                    All solid food Carbonated beverages, regular and diet                                    Cranberry, grape and apple juices Sports drinks like Gatorade Lightly seasoned clear broth or consume(fat free) Sugar, honey syrup  Sample Menu Breakfast                                Lunch                                     Supper Cranberry juice                    Beef  broth                            Chicken broth Jell-O                                     Grape juice                           Apple juice Coffee or tea                         Jell-O                                      Popsicle                                                Coffee or tea                        Coffee or tea  _____________________________________________________________________       Take these medicines the morning of surgery with A SIP OF WATER: None  BRUSH YOUR TEETH MORNING OF SURGERY AND RINSE YOUR MOUTH OUT, NO CHEWING GUM CANDY OR MINTS.                                 You may not have any metal on your body including hair pins and              piercings     Do not wear jewelry, make-up, lotions, powders or perfumes, deodorant              Do not wear nail polish on your fingernails.  Do not shave  48 hours prior to surgery.             Do not bring valuables to the hospital. [place].  Contacts, dentures or bridgework may not be worn into surgery.  YOU MAY BRING OVERNIGHT BAG    Special Instructions: N/A              Please read over the following fact sheets you were given: _____________________________________________________________________             [facility] - Preparing for Surgery Before surgery, you can play an important role.  Because skin is not sterile, your skin needs to be as free of germs as possible.  You can reduce the number of germs on your skin by washing with CHG (chlorahexidine gluconate) soap before surgery.  CHG is an antiseptic cleaner which kills germs and bonds with the skin to continue killing germs even after washing. Please DO NOT use if you have an allergy to CHG or antibacterial soaps.  If your skin becomes reddened/irritated stop using the CHG and inform your nurse when you arrive at Short Stay. Do not shave (including legs and underarms) for at least 48 hours prior  to the first CHG shower.  You may shave your face/neck. Please follow these instructions carefully:  1.  Shower with CHG Soap the night before surgery and the   morning of Surgery.  2.  If you choose to wash your hair, wash your hair first as usual with your  normal  shampoo.  3.  After you shampoo, rinse your hair and body thoroughly to remove the  shampoo.                           4.  Use CHG as you would any other liquid soap.  You can apply chg directly  to the skin and wash                       Gently with a scrungie or clean washcloth.  5.  Apply the CHG Soap to your body ONLY FROM THE NECK DOWN.   Do not use on face/ open                           Wound or open sores. Avoid contact with eyes, ears mouth and genitals (private parts).                       Wash face,  Genitals (private parts) with your normal soap.             6.  Wash thoroughly, paying special attention to the area where your surgery  will be performed.  7.  Thoroughly rinse your body with warm water from the neck down.  8.  DO NOT shower/wash with your normal soap after using and rinsing off  the CHG Soap.                9.  Pat yourself dry with a clean towel.            10.  Wear clean pajamas.            11.  Place clean sheets on your bed the night of your first shower and do not  sleep with pets. Day of Surgery : Do not apply any lotions/deodorants the morning of surgery.  Please wear clean clothes to the hospital/surgery center.  FAILURE TO FOLLOW THESE INSTRUCTIONS MAY RESULT IN THE CANCELLATION OF YOUR SURGERY PATIENT SIGNATURE_________________________________  NURSE SIGNATURE__________________________________  ________________________________________________________________________

## 2019-04-08 ENCOUNTER — Encounter (HOSPITAL_COMMUNITY)
Admission: RE | Admit: 2019-04-08 | Discharge: 2019-04-08 | Disposition: A | Discharge status: Home / Self Care | Payer: Medicare Other | Source: Ambulatory Visit | Attending: Surgery | Admitting: Surgery

## 2019-04-08 ENCOUNTER — Encounter (HOSPITAL_COMMUNITY): Payer: Self-pay

## 2019-04-08 ENCOUNTER — Encounter (HOSPITAL_COMMUNITY): Payer: Self-pay | Admitting: Surgery

## 2019-04-08 ENCOUNTER — Other Ambulatory Visit: Payer: Self-pay

## 2019-04-08 DIAGNOSIS — E042 Nontoxic multinodular goiter: Secondary | ICD-10-CM | POA: Diagnosis present

## 2019-04-08 DIAGNOSIS — D440 Neoplasm of uncertain behavior of thyroid gland: Secondary | ICD-10-CM | POA: Diagnosis present

## 2019-04-08 DIAGNOSIS — Z01818 Encounter for other preprocedural examination: Secondary | ICD-10-CM | POA: Diagnosis present

## 2019-04-08 HISTORY — DX: Other specified postprocedural states: Z98.890

## 2019-04-08 HISTORY — DX: Hypothyroidism, unspecified: E03.9

## 2019-04-08 HISTORY — DX: Nausea with vomiting, unspecified: R11.2

## 2019-04-08 HISTORY — DX: Pneumonia, unspecified organism: J18.9

## 2019-04-08 HISTORY — DX: Other complications of anesthesia, initial encounter: T88.59XA

## 2019-04-08 HISTORY — DX: Malignant (primary) neoplasm, unspecified: C80.1

## 2019-04-08 NOTE — H&P (Signed)
General Surgery [name]  Tammy Lawrence DOB: 29-Oct-1945 Divorced / Language: Tammy Lawrence / Race: White Female   History of Present Illness  The patient is a 74 year old female who presents with a thyroid nodule.  CHIEF COMPLAINT: thyroid neoplasm of uncertain behavior, multiple thyroid nodules  Asian is referred by Dr. 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 Patient's primary care physician is Dr. Dorthy Lawrence. Patient had been undergoing an evaluation in 2019 for an abnormal chest x-ray. CT scan showed an incidental finding of thyroid nodule. Patient underwent an ultrasound in December 2019 showing a dominant nodule in the right inferior thyroid lobe measuring 3.3 cm. This was felt to be suspicious and biopsy was recommended. Patient underwent fine-needle aspiration biopsy on March 19, 2018. This showed atypia of undetermined significance. Unfortunately, additional testing was not performed. Patient ultimately had a repeat biopsy in October 2020. She underwent AFIRMA testing which demonstrates a suspicious neoplasm, rendering approximately a 50% risk of malignancy. Patient is now referred to surgery for consideration for resection for definitive diagnosis and management. Patient has no prior history of head or neck surgery. She has never been on thyroid medication. Her most recent TSH level is normal at 1.51. She does have a significant family history of benign nodular thyroid disease in her brother, her mother, and her sister. There is no family history of thyroid malignancy or other endocrine neoplasms. Patient denies any compressive symptoms. She denies tremors or palpitations. She presents today to discuss thyroid surgery.   Past Surgical History  Colon Polyp Removal - Colonoscopy  Oral Surgery  Sentinel Lymph Node Biopsy  Tonsillectomy   Diagnostic  Studies History Colonoscopy  within last year Mammogram  within last year Pap Smear  1-5 years ago  Allergies No Known Drug Allergies  Medication History  Atorvastatin Calcium (40MG  Tablet, Oral) Active. Fenofibrate (145MG  Tablet, Oral) Active. Fluticasone Propionate (50MCG/ACT Suspension, Nasal) Active. Tums (500MG  Tablet Chewable, Oral) Active. Advil (200MG  Tablet, Oral) Active. Centrum Silver (Oral) Active. Medications Reconciled  Social History  Alcohol use  Remotely quit alcohol use. Caffeine use  Carbonated beverages, Tea. No drug use  Tobacco use  Current every day smoker.  Family History Arthritis  Father, Mother. Bleeding disorder  Son. Colon Cancer  Son. Colon Polyps  Mother, Sister. Diabetes Mellitus  Brother, Mother, Sister. Hypertension  Brother, Mother, Sister. Kidney Disease  Sister. Seizure disorder  Father. Thyroid problems  Brother, Mother, Sister.  Pregnancy / Birth History Age at menarche  67 years. Age of menopause  51-55 Contraceptive History  Intrauterine device, Oral contraceptives. Gravida  1 Maternal age  16-25 Para  1  Other Problems Arthritis  Back Pain  Bladder Problems  Hypercholesterolemia   Review of Systems  General Not Present- Appetite Loss, Chills, Fatigue, Fever, Night Sweats, Weight Gain and Weight Loss. HEENT Present- Seasonal Allergies and Wears glasses/contact lenses. Not Present- Earache, Hearing Loss, Hoarseness, Nose Bleed, Oral Ulcers, Ringing in the Ears, Sinus Pain, Sore Throat, Visual Disturbances and Yellow Eyes. Cardiovascular Present- Shortness of Breath. Not Present- Chest Pain, Difficulty Breathing Lying Down, Leg Cramps, Palpitations, Rapid Heart Rate and Swelling of Extremities. Gastrointestinal Present- Abdominal Pain, Bloating and Indigestion. Not Present- Bloody Stool, Change in Bowel Habits, Chronic diarrhea, Constipation, Difficulty Swallowing, Excessive gas, Gets full  quickly at meals, Hemorrhoids, Nausea, Rectal Pain and Vomiting. Female Genitourinary Present- Urgency. Not Present- Frequency, Nocturia, Painful Urination and Pelvic Pain. Musculoskeletal Present-  Back Pain and Joint Pain. Not Present- Joint Stiffness, Muscle Pain, Muscle Weakness and Swelling of Extremities. Psychiatric Not Present- Anxiety, Bipolar, Change in Sleep Pattern, Depression, Fearful and Frequent crying. Endocrine Present- Hot flashes. Not Present- Cold Intolerance, Excessive Hunger, Hair Changes, Heat Intolerance and New Diabetes.  Vitals  Weight: 138.13 lb Height: 59in Body Surface Area: 1.58 m Body Mass Index: 27.9 kg/m  Temp.: 98.5F  Pulse: 85 (Regular)  P.OX: 96% (Room air) BP: 156/102 (Sitting, Left Arm, Standard)  Physical Exam   GENERAL APPEARANCE Development: normal Nutritional status: normal Gross deformities: none  SKIN Rash, lesions, ulcers: none Induration, erythema: none Nodules: none palpable  EYES Conjunctiva and lids: normal Pupils: equal and reactive Iris: normal bilaterally  EARS, NOSE, MOUTH, THROAT External ears: no lesion or deformity External nose: no lesion or deformity Hearing: grossly normal Patient is wearing a mask.  NECK Symmetric: yes Trachea: midline Thyroid: With the neck extended and a swallowing maneuver, there is a visible mass in the inferior right thyroid lobe. This is also palpable and appears to be smooth and nontender measuring approximately 2.5 cm in size. There is no palpable abnormality in the left thyroid bed. There is no associated lymphadenopathy.  CHEST Respiratory effort: normal Retraction or accessory muscle use: no Breath sounds: normal bilaterally Rales, rhonchi, wheeze: none  CARDIOVASCULAR Auscultation: regular rhythm, normal rate Murmurs: none Pulses: carotid and radial pulse 2+ palpable Lower extremity edema: none Lower extremity varicosities: none  MUSCULOSKELETAL Station and  gait: normal Digits and nails: no clubbing or cyanosis Muscle strength: grossly normal all extremities Range of motion: grossly normal all extremities Deformity: none  LYMPHATIC Cervical: none palpable Supraclavicular: none palpable  PSYCHIATRIC Oriented to person, place, and time: yes Mood and affect: normal for situation Judgment and insight: appropriate for situation    Assessment & Plan   NEOPLASM OF UNCERTAIN BEHAVIOR OF THYROID GLAND (D44.0) MULTIPLE THYROID NODULES (E04.2)  Patient presents today on referral from her endocrinologist for surgical evaluation and management of a 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 Patient is provided with written literature on thyroid surgery to review at home.  Patient has a dominant 3.3 cm nodule in the inferior right thyroid lobe which is suspicious on molecular genetic testing. This gives her a risk of malignancy of approximately 50%. Ultrasound also shows bilateral thyroid nodules. Based on these findings, I have recommended total thyroidectomy as the procedure of choice. We discussed the risk and benefits of this procedure. We discussed thyroid lobectomy as an alternative treatment. We discussed the risk of recurrent laryngeal nerve injury and injury to parathyroid glands. We discussed the location and size of the surgical incision. We discussed the hospital stay to be anticipated. We discussed the postoperative recovery and return to activities. We discussed the need for lifelong thyroid hormone replacement. We discussed the potential need for radioactive iodine treatment. The patient understands and wishes to proceed with surgery in the near future.  The risks and benefits of the procedure have been discussed at length with the patient. The patient understands the proposed procedure, potential alternative treatments, and the course of recovery to be expected. All of the patient's  questions have been answered at this time. The patient wishes to proceed with surgery.  Armandina Gemma, MD [name] Office: [phone]

## 2019-04-08 NOTE — H&P (View-Only) (Signed)
General Surgery [name]  BORGHILD WIDDER DOB: 1945-04-17 Divorced / Language: [name] / Race: White Female   History of Present Illness  The patient is a 74 year old female who presents with a thyroid nodule.  CHIEF COMPLAINT: thyroid neoplasm of uncertain behavior, multiple thyroid nodules  Asian is referred by Dr. 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 Patient's primary care physician is Dr. Dorthy Cooler. Patient had been undergoing an evaluation in 2019 for an abnormal chest x-ray. CT scan showed an incidental finding of thyroid nodule. Patient underwent an ultrasound in December 2019 showing a dominant nodule in the right inferior thyroid lobe measuring 3.3 cm. This was felt to be suspicious and biopsy was recommended. Patient underwent fine-needle aspiration biopsy on March 19, 2018. This showed atypia of undetermined significance. Unfortunately, additional testing was not performed. Patient ultimately had a repeat biopsy in October 2020. She underwent AFIRMA testing which demonstrates a suspicious neoplasm, rendering approximately a 50% risk of malignancy. Patient is now referred to surgery for consideration for resection for definitive diagnosis and management. Patient has no prior history of head or neck surgery. She has never been on thyroid medication. Her most recent TSH level is normal at 1.51. She does have a significant family history of benign nodular thyroid disease in her brother, her mother, and her sister. There is no family history of thyroid malignancy or other endocrine neoplasms. Patient denies any compressive symptoms. She denies tremors or palpitations. She presents today to discuss thyroid surgery.   Past Surgical History  Colon Polyp Removal - Colonoscopy  Oral Surgery  Sentinel Lymph Node Biopsy  Tonsillectomy   Diagnostic  Studies History Colonoscopy  within last year Mammogram  within last year Pap Smear  1-5 years ago  Allergies No Known Drug Allergies  Medication History  Atorvastatin Calcium (40MG  Tablet, Oral) Active. Fenofibrate (145MG  Tablet, Oral) Active. Fluticasone Propionate (50MCG/ACT Suspension, Nasal) Active. Tums (500MG  Tablet Chewable, Oral) Active. Advil (200MG  Tablet, Oral) Active. Centrum Silver (Oral) Active. Medications Reconciled  Social History  Alcohol use  Remotely quit alcohol use. Caffeine use  Carbonated beverages, Tea. No drug use  Tobacco use  Current every day smoker.  Family History Arthritis  Father, Mother. Bleeding disorder  Son. Colon Cancer  Son. Colon Polyps  Mother, Sister. Diabetes Mellitus  Brother, Mother, Sister. Hypertension  Brother, Mother, Sister. Kidney Disease  Sister. Seizure disorder  Father. Thyroid problems  Brother, Mother, Sister.  Pregnancy / Birth History Age at menarche  48 years. Age of menopause  51-55 Contraceptive History  Intrauterine device, Oral contraceptives. Gravida  1 Maternal age  65-25 Para  1  Other Problems Arthritis  Back Pain  Bladder Problems  Hypercholesterolemia   Review of Systems  General Not Present- Appetite Loss, Chills, Fatigue, Fever, Night Sweats, Weight Gain and Weight Loss. HEENT Present- Seasonal Allergies and Wears glasses/contact lenses. Not Present- Earache, Hearing Loss, Hoarseness, Nose Bleed, Oral Ulcers, Ringing in the Ears, Sinus Pain, Sore Throat, Visual Disturbances and Yellow Eyes. Cardiovascular Present- Shortness of Breath. Not Present- Chest Pain, Difficulty Breathing Lying Down, Leg Cramps, Palpitations, Rapid Heart Rate and Swelling of Extremities. Gastrointestinal Present- Abdominal Pain, Bloating and Indigestion. Not Present- Bloody Stool, Change in Bowel Habits, Chronic diarrhea, Constipation, Difficulty Swallowing, Excessive gas, Gets full  quickly at meals, Hemorrhoids, Nausea, Rectal Pain and Vomiting. Female Genitourinary Present- Urgency. Not Present- Frequency, Nocturia, Painful Urination and Pelvic Pain. Musculoskeletal Present-  Back Pain and Joint Pain. Not Present- Joint Stiffness, Muscle Pain, Muscle Weakness and Swelling of Extremities. Psychiatric Not Present- Anxiety, Bipolar, Change in Sleep Pattern, Depression, Fearful and Frequent crying. Endocrine Present- Hot flashes. Not Present- Cold Intolerance, Excessive Hunger, Hair Changes, Heat Intolerance and New Diabetes.  Vitals  Weight: 138.13 lb Height: 59in Body Surface Area: 1.58 m Body Mass Index: 27.9 kg/m  Temp.: 98.46F  Pulse: 85 (Regular)  P.OX: 96% (Room air) BP: 156/102 (Sitting, Left Arm, Standard)  Physical Exam   GENERAL APPEARANCE Development: normal Nutritional status: normal Gross deformities: none  SKIN Rash, lesions, ulcers: none Induration, erythema: none Nodules: none palpable  EYES Conjunctiva and lids: normal Pupils: equal and reactive Iris: normal bilaterally  EARS, NOSE, MOUTH, THROAT External ears: no lesion or deformity External nose: no lesion or deformity Hearing: grossly normal Patient is wearing a mask.  NECK Symmetric: yes Trachea: midline Thyroid: With the neck extended and a swallowing maneuver, there is a visible mass in the inferior right thyroid lobe. This is also palpable and appears to be smooth and nontender measuring approximately 2.5 cm in size. There is no palpable abnormality in the left thyroid bed. There is no associated lymphadenopathy.  CHEST Respiratory effort: normal Retraction or accessory muscle use: no Breath sounds: normal bilaterally Rales, rhonchi, wheeze: none  CARDIOVASCULAR Auscultation: regular rhythm, normal rate Murmurs: none Pulses: carotid and radial pulse 2+ palpable Lower extremity edema: none Lower extremity varicosities: none  MUSCULOSKELETAL Station and  gait: normal Digits and nails: no clubbing or cyanosis Muscle strength: grossly normal all extremities Range of motion: grossly normal all extremities Deformity: none  LYMPHATIC Cervical: none palpable Supraclavicular: none palpable  PSYCHIATRIC Oriented to person, place, and time: yes Mood and affect: normal for situation Judgment and insight: appropriate for situation    Assessment & Plan   NEOPLASM OF UNCERTAIN BEHAVIOR OF THYROID GLAND (D44.0) MULTIPLE THYROID NODULES (E04.2)  Patient presents today on referral from her endocrinologist for surgical evaluation and management of a 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 Patient is provided with written literature on thyroid surgery to review at home.  Patient has a dominant 3.3 cm nodule in the inferior right thyroid lobe which is suspicious on molecular genetic testing. This gives her a risk of malignancy of approximately 50%. Ultrasound also shows bilateral thyroid nodules. Based on these findings, I have recommended total thyroidectomy as the procedure of choice. We discussed the risk and benefits of this procedure. We discussed thyroid lobectomy as an alternative treatment. We discussed the risk of recurrent laryngeal nerve injury and injury to parathyroid glands. We discussed the location and size of the surgical incision. We discussed the hospital stay to be anticipated. We discussed the postoperative recovery and return to activities. We discussed the need for lifelong thyroid hormone replacement. We discussed the potential need for radioactive iodine treatment. The patient understands and wishes to proceed with surgery in the near future.  The risks and benefits of the procedure have been discussed at length with the patient. The patient understands the proposed procedure, potential alternative treatments, and the course of recovery to be expected. All of the patient's  questions have been answered at this time. The patient wishes to proceed with surgery.  Armandina Gemma, MD [name] Office: [phone]

## 2019-04-10 ENCOUNTER — Encounter (HOSPITAL_COMMUNITY)
Admission: RE | Admit: 2019-04-10 | Discharge: 2019-04-10 | Disposition: A | Discharge status: Home / Self Care | Payer: Medicare PPO | Source: Ambulatory Visit | Attending: Surgery | Admitting: Surgery

## 2019-04-10 ENCOUNTER — Ambulatory Visit (HOSPITAL_COMMUNITY)
Admission: RE | Admit: 2019-04-10 | Discharge: 2019-04-10 | Disposition: A | Discharge status: Home / Self Care | Payer: Medicare PPO | Source: Ambulatory Visit | Attending: Anesthesiology | Admitting: Anesthesiology

## 2019-04-10 ENCOUNTER — Other Ambulatory Visit: Payer: Self-pay

## 2019-04-10 ENCOUNTER — Other Ambulatory Visit (HOSPITAL_COMMUNITY)
Admission: RE | Admit: 2019-04-10 | Discharge: 2019-04-10 | Disposition: A | Discharge status: Home / Self Care | Payer: Medicare PPO | Source: Ambulatory Visit | Attending: Surgery | Admitting: Surgery

## 2019-04-10 ENCOUNTER — Encounter (HOSPITAL_COMMUNITY): Payer: Self-pay | Admitting: Physician Assistant

## 2019-04-10 DIAGNOSIS — Z20822 Contact with and (suspected) exposure to covid-19: Secondary | ICD-10-CM | POA: Insufficient documentation

## 2019-04-10 DIAGNOSIS — Z01811 Encounter for preprocedural respiratory examination: Secondary | ICD-10-CM | POA: Diagnosis present

## 2019-04-10 DIAGNOSIS — Z01818 Encounter for other preprocedural examination: Secondary | ICD-10-CM | POA: Diagnosis not present

## 2019-04-10 LAB — BASIC METABOLIC PANEL
Anion gap: 7 (ref 5–15)
BUN: 15 mg/dL (ref 8–23)
CO2: 26 mmol/L (ref 22–32)
Calcium: 10.1 mg/dL (ref 8.9–10.3)
Chloride: 109 mmol/L (ref 98–111)
Creatinine, Ser: 0.89 mg/dL (ref 0.44–1.00)
GFR calc Af Amer: >60 mL/min (ref >60)
GFR calc non Af Amer: >60 mL/min (ref >60)
Glucose, Bld: 116 mg/dL — ABNORMAL HIGH (ref 70–99)
Potassium: 4.3 mmol/L (ref 3.5–5.1)
Sodium: 142 mmol/L (ref 135–145)

## 2019-04-10 LAB — CBC
HCT: 44.8 % (ref 36.0–46.0)
Hemoglobin: 15.2 g/dL — ABNORMAL HIGH (ref 12.0–15.0)
MCH: 33 pg (ref 26.0–34.0)
MCHC: 33.9 g/dL (ref 30.0–36.0)
MCV: 97.2 fL (ref 80.0–100.0)
Platelets: 334 10*3/uL (ref 150–400)
RBC: 4.61 MIL/uL (ref 3.87–5.11)
RDW: 11.9 % (ref 11.5–15.5)
WBC: 7.1 10*3/uL (ref 4.0–10.5)
nRBC: 0 % (ref 0.0–0.2)

## 2019-04-12 LAB — NOVEL CORONAVIRUS, NAA (HOSP ORDER, SEND-OUT TO REF LAB; TAT 18-24 HRS): SARS-CoV-2, NAA: NOT DETECTED

## 2019-04-14 ENCOUNTER — Ambulatory Visit (HOSPITAL_COMMUNITY): Payer: Medicare PPO | Admitting: Anesthesiology

## 2019-04-14 ENCOUNTER — Observation Stay (HOSPITAL_COMMUNITY)
Admission: RE | Admit: 2019-04-14 | Discharge: 2019-04-15 | Disposition: A | Discharge status: Home / Self Care | Payer: Medicare PPO | Attending: Surgery | Admitting: Surgery

## 2019-04-14 ENCOUNTER — Encounter (HOSPITAL_COMMUNITY): Payer: Self-pay | Admitting: Surgery

## 2019-04-14 ENCOUNTER — Encounter (HOSPITAL_COMMUNITY)
Admission: RE | Disposition: A | Discharge status: Home / Self Care | Payer: Self-pay | Source: Home / Self Care | Attending: Surgery

## 2019-04-14 ENCOUNTER — Encounter (HOSPITAL_COMMUNITY): Admission: RE | Payer: Self-pay | Source: Ambulatory Visit

## 2019-04-14 ENCOUNTER — Other Ambulatory Visit: Payer: Self-pay

## 2019-04-14 ENCOUNTER — Ambulatory Visit (HOSPITAL_COMMUNITY): Admission: RE | Admit: 2019-04-14 | Payer: Medicare PPO | Source: Ambulatory Visit | Admitting: Surgery

## 2019-04-14 DIAGNOSIS — C73 Malignant neoplasm of thyroid gland: Principal | ICD-10-CM | POA: Insufficient documentation

## 2019-04-14 DIAGNOSIS — E785 Hyperlipidemia, unspecified: Secondary | ICD-10-CM | POA: Insufficient documentation

## 2019-04-14 DIAGNOSIS — Z841 Family history of disorders of kidney and ureter: Secondary | ICD-10-CM | POA: Diagnosis not present

## 2019-04-14 DIAGNOSIS — M549 Dorsalgia, unspecified: Secondary | ICD-10-CM | POA: Insufficient documentation

## 2019-04-14 DIAGNOSIS — Z800 Family history of malignant neoplasm of digestive organs: Secondary | ICD-10-CM | POA: Diagnosis not present

## 2019-04-14 DIAGNOSIS — E042 Nontoxic multinodular goiter: Secondary | ICD-10-CM | POA: Diagnosis not present

## 2019-04-14 DIAGNOSIS — Z79899 Other long term (current) drug therapy: Secondary | ICD-10-CM | POA: Insufficient documentation

## 2019-04-14 DIAGNOSIS — Z8371 Family history of colonic polyps: Secondary | ICD-10-CM | POA: Insufficient documentation

## 2019-04-14 DIAGNOSIS — Z832 Family history of diseases of the blood and blood-forming organs and certain disorders involving the immune mechanism: Secondary | ICD-10-CM | POA: Diagnosis not present

## 2019-04-14 DIAGNOSIS — Z86010 Personal history of colonic polyps: Secondary | ICD-10-CM | POA: Insufficient documentation

## 2019-04-14 DIAGNOSIS — E7800 Pure hypercholesterolemia, unspecified: Secondary | ICD-10-CM | POA: Insufficient documentation

## 2019-04-14 DIAGNOSIS — Z791 Long term (current) use of non-steroidal anti-inflammatories (NSAID): Secondary | ICD-10-CM | POA: Diagnosis not present

## 2019-04-14 DIAGNOSIS — Z8261 Family history of arthritis: Secondary | ICD-10-CM | POA: Diagnosis not present

## 2019-04-14 DIAGNOSIS — M1990 Unspecified osteoarthritis, unspecified site: Secondary | ICD-10-CM | POA: Diagnosis not present

## 2019-04-14 DIAGNOSIS — D440 Neoplasm of uncertain behavior of thyroid gland: Secondary | ICD-10-CM | POA: Diagnosis present

## 2019-04-14 DIAGNOSIS — Z820 Family history of epilepsy and other diseases of the nervous system: Secondary | ICD-10-CM | POA: Insufficient documentation

## 2019-04-14 DIAGNOSIS — F17200 Nicotine dependence, unspecified, uncomplicated: Secondary | ICD-10-CM | POA: Insufficient documentation

## 2019-04-14 DIAGNOSIS — Z8249 Family history of ischemic heart disease and other diseases of the circulatory system: Secondary | ICD-10-CM | POA: Insufficient documentation

## 2019-04-14 HISTORY — PX: THYROIDECTOMY: SHX17

## 2019-04-14 SURGERY — THYROIDECTOMY
Anesthesia: General

## 2019-04-14 MED ORDER — CHLORHEXIDINE GLUCONATE CLOTH 2 % EX PADS: 6.0000 | MEDICATED_PAD | Freq: Once | CUTANEOUS | Status: DC

## 2019-04-14 MED ORDER — CEFAZOLIN SODIUM-DEXTROSE 2-4 GM/100ML-% IV SOLN
2.0000 g | INTRAVENOUS | Status: AC
  Administered 2019-04-14: 2 g via INTRAVENOUS
  Filled 2019-04-14: qty 100

## 2019-04-14 MED ORDER — SUGAMMADEX SODIUM 200 MG/2ML IV SOLN
INTRAVENOUS | Status: DC | PRN
  Administered 2019-04-14: 300 mg via INTRAVENOUS

## 2019-04-14 MED ORDER — ONDANSETRON HCL 4 MG/2ML IJ SOLN: 4.0000 mg | Freq: Once | INTRAMUSCULAR | Status: DC | PRN

## 2019-04-14 MED ORDER — LIDOCAINE HCL (CARDIAC) PF 100 MG/5ML IV SOSY
PREFILLED_SYRINGE | INTRAVENOUS | Status: DC | PRN
  Administered 2019-04-14: 80 mg via INTRAVENOUS

## 2019-04-14 MED ORDER — DIPHENHYDRAMINE HCL 50 MG/ML IJ SOLN
12.5000 mg | Freq: Once | INTRAMUSCULAR | Status: AC
  Administered 2019-04-14: 12.5 mg via INTRAVENOUS
  Filled 2019-04-14: qty 1

## 2019-04-14 MED ORDER — LACTATED RINGERS IV SOLN: INTRAVENOUS | Status: DC

## 2019-04-14 MED ORDER — DEXAMETHASONE SODIUM PHOSPHATE 10 MG/ML IJ SOLN
INTRAMUSCULAR | Status: DC | PRN
  Administered 2019-04-14: 10 mg via INTRAVENOUS

## 2019-04-14 MED ORDER — ONDANSETRON 4 MG PO TBDP: 4.0000 mg | ORAL_TABLET | Freq: Four times a day (QID) | ORAL | Status: DC | PRN

## 2019-04-14 MED ORDER — TRAMADOL HCL 50 MG PO TABS
50.0000 mg | ORAL_TABLET | Freq: Four times a day (QID) | ORAL | Status: DC | PRN
  Administered 2019-04-14: 50 mg via ORAL
  Filled 2019-04-14: qty 1

## 2019-04-14 MED ORDER — FENTANYL CITRATE (PF) 100 MCG/2ML IJ SOLN
INTRAMUSCULAR | Status: DC | PRN
  Administered 2019-04-14 (×2): 50 ug via INTRAVENOUS
  Administered 2019-04-14 (×2): 100 ug via INTRAVENOUS

## 2019-04-14 MED ORDER — LACTATED RINGERS IV SOLN: INTRAVENOUS | Status: DC | PRN

## 2019-04-14 MED ORDER — FENTANYL CITRATE (PF) 100 MCG/2ML IJ SOLN
25.0000 ug | INTRAMUSCULAR | Status: DC | PRN
  Administered 2019-04-14 (×2): 25 ug via INTRAVENOUS
  Administered 2019-04-14: 50 ug via INTRAVENOUS

## 2019-04-14 MED ORDER — ACETAMINOPHEN 325 MG PO TABS: 650.0000 mg | ORAL_TABLET | Freq: Four times a day (QID) | ORAL | Status: DC | PRN

## 2019-04-14 MED ORDER — CALCIUM CARBONATE 1250 (500 CA) MG PO TABS
2.0000 | ORAL_TABLET | Freq: Three times a day (TID) | ORAL | Status: DC
  Administered 2019-04-14: 1000 mg via ORAL
  Filled 2019-04-14 (×2): qty 1

## 2019-04-14 MED ORDER — MEPERIDINE HCL 50 MG/ML IJ SOLN: 6.2500 mg | INTRAMUSCULAR | Status: DC | PRN

## 2019-04-14 MED ORDER — ROCURONIUM BROMIDE 100 MG/10ML IV SOLN
INTRAVENOUS | Status: DC | PRN
  Administered 2019-04-14: 60 mg via INTRAVENOUS
  Administered 2019-04-14: 15 mg via INTRAVENOUS

## 2019-04-14 MED ORDER — PROPOFOL 10 MG/ML IV BOLUS
INTRAVENOUS | Status: DC | PRN
  Administered 2019-04-14: 170 mg via INTRAVENOUS

## 2019-04-14 MED ORDER — HYDROMORPHONE HCL 1 MG/ML IJ SOLN: 1.0000 mg | INTRAMUSCULAR | Status: DC | PRN

## 2019-04-14 MED ORDER — OXYCODONE HCL 5 MG PO TABS: 5.0000 mg | ORAL_TABLET | Freq: Once | ORAL | Status: DC | PRN

## 2019-04-14 MED ORDER — PHENYLEPHRINE 40 MCG/ML (10ML) SYRINGE FOR IV PUSH (FOR BLOOD PRESSURE SUPPORT)
PREFILLED_SYRINGE | INTRAVENOUS | Status: DC | PRN
  Administered 2019-04-14: 120 ug via INTRAVENOUS

## 2019-04-14 MED ORDER — ONDANSETRON HCL 4 MG/2ML IJ SOLN: 4.0000 mg | Freq: Four times a day (QID) | INTRAMUSCULAR | Status: DC | PRN

## 2019-04-14 MED ORDER — FENTANYL CITRATE (PF) 100 MCG/2ML IJ SOLN
INTRAMUSCULAR | Status: AC
  Filled 2019-04-14: qty 2

## 2019-04-14 MED ORDER — ONDANSETRON HCL 4 MG/2ML IJ SOLN
INTRAMUSCULAR | Status: DC | PRN
  Administered 2019-04-14: 4 mg via INTRAVENOUS

## 2019-04-14 MED ORDER — KCL IN DEXTROSE-NACL 20-5-0.45 MEQ/L-%-% IV SOLN
INTRAVENOUS | Status: DC
  Filled 2019-04-14: qty 1000

## 2019-04-14 MED ORDER — OXYCODONE HCL 5 MG/5ML PO SOLN: 5.0000 mg | Freq: Once | ORAL | Status: DC | PRN

## 2019-04-14 MED ORDER — SODIUM CHLORIDE 0.9 % IR SOLN
Status: DC | PRN
  Administered 2019-04-14: 1000 mL

## 2019-04-14 MED ORDER — OXYCODONE HCL 5 MG PO TABS: 5.0000 mg | ORAL_TABLET | ORAL | Status: DC | PRN

## 2019-04-14 MED ORDER — ACETAMINOPHEN 650 MG RE SUPP: 650.0000 mg | Freq: Four times a day (QID) | RECTAL | Status: DC | PRN

## 2019-04-14 SURGICAL SUPPLY — 29 items
ATTRACTOMAT 16X20 MAGNETIC DRP (DRAPES) ×3 IMPLANT
BLADE SURG 15 STRL LF DISP TIS (BLADE) ×1 IMPLANT
BLADE SURG 15 STRL SS (BLADE) ×2
CHLORAPREP W/TINT 26 (MISCELLANEOUS) ×3 IMPLANT
CLIP VESOCCLUDE MED 6/CT (CLIP) ×6 IMPLANT
CLIP VESOCCLUDE SM WIDE 6/CT (CLIP) ×15 IMPLANT
COVER SURGICAL LIGHT HANDLE (MISCELLANEOUS) ×3 IMPLANT
COVER WAND RF STERILE (DRAPES) ×3 IMPLANT
DERMABOND ADVANCED (GAUZE/BANDAGES/DRESSINGS) ×2
DERMABOND ADVANCED .7 DNX12 (GAUZE/BANDAGES/DRESSINGS) ×1 IMPLANT
DRAPE LAPAROTOMY T 98X78 PEDS (DRAPES) ×3 IMPLANT
ELECT REM PT RETURN 15FT ADLT (MISCELLANEOUS) ×3 IMPLANT
GAUZE 4X4 16PLY RFD (DISPOSABLE) ×3 IMPLANT
GLOVE SURG ORTHO 8.0 STRL STRW (GLOVE) ×3 IMPLANT
GOWN STRL REUS W/TWL XL LVL3 (GOWN DISPOSABLE) ×6 IMPLANT
HEMOSTAT SURGICEL 2X4 FIBR (HEMOSTASIS) ×3 IMPLANT
ILLUMINATOR WAVEGUIDE N/F (MISCELLANEOUS) ×3 IMPLANT
KIT BASIN OR (CUSTOM PROCEDURE TRAY) ×3 IMPLANT
KIT TURNOVER KIT A (KITS) IMPLANT
PACK BASIC VI WITH GOWN DISP (CUSTOM PROCEDURE TRAY) ×3 IMPLANT
PENCIL SMOKE EVACUATOR (MISCELLANEOUS) ×3 IMPLANT
SHEARS HARMONIC 9CM CVD (BLADE) ×3 IMPLANT
SUT MNCRL AB 4-0 PS2 18 (SUTURE) ×3 IMPLANT
SUT VIC AB 3-0 SH 18 (SUTURE) ×6 IMPLANT
SYR BULB IRRIGATION 50ML (SYRINGE) ×3 IMPLANT
TOWEL OR 17X26 10 PK STRL BLUE (TOWEL DISPOSABLE) ×3 IMPLANT
TOWEL OR NON WOVEN STRL DISP B (DISPOSABLE) ×3 IMPLANT
TUBING CONNECTING 10 (TUBING) ×2 IMPLANT
TUBING CONNECTING 10' (TUBING) ×1

## 2019-04-14 NOTE — Transfer of Care (Signed)
Immediate Anesthesia Transfer of Care Note  Patient: Tammy Lawrence  Procedure(s) Performed: TOTAL THYROIDECTOMY (N/A )  Patient Location: PACU  Anesthesia Type:General  Level of Consciousness: awake, alert  and oriented  Airway & Oxygen Therapy: Patient Spontanous Breathing and Patient connected to face mask oxygen  Post-op Assessment: Report given to RN and Post -op Vital signs reviewed and stable  Post vital signs: Reviewed and stable  Last Vitals:  Vitals Value Taken Time  BP    Temp    Pulse 94 04/14/19 1228  Resp 14 04/14/19 1226  SpO2 99 % 04/14/19 1228  Vitals shown include unvalidated device data.  Last Pain:  Vitals:   04/14/19 0832  TempSrc:   PainSc: 0-No pain         Complications: No apparent anesthesia complications

## 2019-04-14 NOTE — Anesthesia Procedure Notes (Signed)
Procedure Name: Intubation Date/Time: 04/14/2019 10:39 AM Performed by: [place] Elfreda Blanchet C, CRNA Pre-anesthesia Checklist: Patient identified, Emergency Drugs available, Suction available and Patient being monitored Patient Re-evaluated:Patient Re-evaluated prior to induction Oxygen Delivery Method: Circle system utilized Preoxygenation: Pre-oxygenation with 100% oxygen Induction Type: IV induction Ventilation: Mask ventilation without difficulty Laryngoscope Size: Mac and 3 Grade View: Grade I Tube type: Oral Tube size: 7.0 mm Number of attempts: 1 Airway Equipment and Method: Stylet and Oral airway Placement Confirmation: ETT inserted through vocal cords under direct vision,  positive ETCO2 and breath sounds checked- equal and bilateral Secured at: 21 cm Tube secured with: Tape Dental Injury: Teeth and Oropharynx as per pre-operative assessment

## 2019-04-14 NOTE — Op Note (Signed)
Procedure Note  Pre-operative Diagnosis:  Thyroid neoplasm of uncertain behavior, multiple thyroid nodules  Post-operative Diagnosis:  same  Surgeon:  Armandina Gemma, MD  Assistant:  none   Procedure:  Total thyroidectomy  Anesthesia:  General  Estimated Blood Loss:  minimal  Drains: none         Specimen: thyroid to pathology  Indications:  The patient is referred by Dr. 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 Patient's primary care physician is Dr. Dorthy Cooler. Patient had been undergoing an evaluation in 2019 for an abnormal chest x-ray. CT scan showed an incidental finding of thyroid nodule. Patient underwent an ultrasound in December 2019 showing a dominant nodule in the right inferior thyroid lobe measuring 3.3 cm. This was felt to be suspicious and biopsy was recommended. Patient underwent fine-needle aspiration biopsy on March 19, 2018. This showed atypia of undetermined significance. Unfortunately, additional testing was not performed. Patient ultimately had a repeat biopsy in October 2020. She underwent AFIRMA testing which demonstrates a suspicious neoplasm, rendering approximately a 50% risk of malignancy. Patient is now referred to surgery for consideration for resection for definitive diagnosis and management.   Procedure Details: Procedure was done in OR #4 at the [facility]. The patient was brought to the operating room and placed in a supine position on the operating room table. Following administration of general anesthesia, the patient was positioned and then prepped and draped in the usual aseptic fashion. After ascertaining that an adequate level of anesthesia had been achieved, a small Kocher incision was made with #15 blade. Dissection was carried through subcutaneous tissues and platysma.Hemostasis was achieved with the electrocautery. Skin flaps were elevated  cephalad and caudad from the thyroid notch to the sternal notch. A [place]orner self-retaining retractor was placed for exposure. Strap muscles were incised in the midline and dissection was begun on the left side.  Strap muscles were reflected laterally.  Left thyroid lobe was normal in size with small nodules.  The left lobe was gently mobilized with blunt dissection. Superior pole vessels were dissected out and divided individually between small and medium ligaclips with the harmonic scalpel. The thyroid lobe was rolled anteriorly. Branches of the inferior thyroid artery were divided between small ligaclips with the harmonic scalpel. Inferior venous tributaries were divided between ligaclips. Both the superior and inferior parathyroid glands were identified and preserved on their vascular pedicles. The recurrent laryngeal nerve was identified and preserved along its course. The ligament of Gwenlyn Found was released with the electrocautery and the gland was mobilized onto the anterior trachea. Isthmus was mobilized across the midline. There was a small pyramidal lobe present which was resected with the isthmus. Dry pack was placed in the left neck.  The right thyroid lobe was gently mobilized with blunt dissection. Right thyroid lobe was mildly enlarged with a dominant nodule inferiorly. Superior pole vessels were dissected out and divided between small and medium ligaclips with the Harmonic scalpel. Superior parathyroid was identified and preserved. Inferior venous tributaries were divided between medium ligaclips with the harmonic scalpel. The right thyroid lobe was rolled anteriorly and the branches of the inferior thyroid artery divided between small ligaclips. The right recurrent laryngeal nerve was identified and preserved along its course. The ligament of Gwenlyn Found was released with the electrocautery. The right thyroid lobe was mobilized onto the anterior trachea and the remainder of the thyroid was dissected off the  anterior trachea and the thyroid was completely  excised. A suture was used to mark the right lobe. The entire thyroid gland was submitted to pathology for review.  The neck was irrigated with warm saline. Fibrillar was placed throughout the operative field. Strap muscles were approximated in the midline with interrupted 3-0 Vicryl sutures. Platysma was closed with interrupted 3-0 Vicryl sutures. Skin was closed with a running 4-0 Monocryl subcuticular suture. Wound was washed and Dermabond was applied. The patient was awakened from anesthesia and brought to the recovery room. The patient tolerated the procedure well.   Armandina Gemma, MD [name] Office: [phone]

## 2019-04-14 NOTE — Anesthesia Postprocedure Evaluation (Signed)
Anesthesia Post Note  Patient: Tammy Lawrence  Procedure(s) Performed: TOTAL THYROIDECTOMY (N/A )     Patient location during evaluation: PACU Anesthesia Type: General Level of consciousness: awake and alert and oriented Pain management: pain level controlled Vital Signs Assessment: post-procedure vital signs reviewed and stable Respiratory status: spontaneous breathing, nonlabored ventilation and respiratory function stable Cardiovascular status: blood pressure returned to baseline and stable Postop Assessment: no apparent nausea or vomiting Anesthetic complications: no    Last Vitals:  Vitals:   04/14/19 1245 04/14/19 1300  BP: (!) 166/79 (!) 165/86  Pulse: 85 77  Resp:    Temp:    SpO2:      Last Pain:  Vitals:   04/14/19 1224  TempSrc:   PainSc: Asleep                 [name] A.

## 2019-04-14 NOTE — Anesthesia Preprocedure Evaluation (Signed)
Anesthesia Evaluation  Patient identified by MRN, date of birth, ID band Patient awake    Reviewed: Allergy & Precautions, NPO status , Patient's Chart, lab work & pertinent test results  History of Anesthesia Complications (+) PONV and history of anesthetic complications  Airway Mallampati: II  TM Distance: >3 FB Neck ROM: Full    Dental no notable dental hx. (+) Teeth Intact   Pulmonary pneumonia, resolved, Current Smoker,    Pulmonary exam normal breath sounds clear to auscultation       Cardiovascular negative cardio ROS Normal cardiovascular exam Rhythm:Regular Rate:Normal     Neuro/Psych negative neurological ROS  negative psych ROS   GI/Hepatic negative GI ROS, Neg liver ROS,   Endo/Other  Hypothyroidism Hyperlipidemia Multiple thyroid nodules Thyroid neoplasm of uncertain behavior  Renal/GU negative Renal ROS  negative genitourinary   Musculoskeletal negative musculoskeletal ROS (+)   Abdominal   Peds  Hematology negative hematology ROS (+)   Anesthesia Other Findings   Reproductive/Obstetrics                             Anesthesia Physical Anesthesia Plan  ASA: II  Anesthesia Plan: General   Post-op Pain Management:    Induction: Intravenous  PONV Risk Score and Plan: 4 or greater and Ondansetron, Treatment may vary due to age or medical condition and Dexamethasone  Airway Management Planned: Oral ETT  Additional Equipment:   Intra-op Plan:   Post-operative Plan: Extubation in OR  Informed Consent: I have reviewed the patients History and Physical, chart, labs and discussed the procedure including the risks, benefits and alternatives for the proposed anesthesia with the patient or authorized representative who has indicated his/her understanding and acceptance.     Dental advisory given  Plan Discussed with: CRNA and Surgeon  Anesthesia Plan Comments:          Anesthesia Quick Evaluation

## 2019-04-14 NOTE — Interval H&P Note (Signed)
History and Physical Interval Note:  04/14/2019 10:24 AM  Tammy Lawrence  has presented today for surgery, with the diagnosis of THYROID NEOPLASM OF UNDERTAIN BEHAVIOR, MULTIPLE THYROID NODULES.  The various methods of treatment have been discussed with the patient and family. After consideration of risks, benefits and other options for treatment, the patient has consented to    Procedure(s): TOTAL THYROIDECTOMY (N/A) as a surgical intervention.    The patient's history has been reviewed, patient examined, no change in status, stable for surgery.  I have reviewed the patient's chart and labs.  Questions were answered to the patient's satisfaction.    Armandina Gemma, MD [name] Office: [place]

## 2019-04-15 DIAGNOSIS — C73 Malignant neoplasm of thyroid gland: Secondary | ICD-10-CM | POA: Diagnosis not present

## 2019-04-15 LAB — BASIC METABOLIC PANEL WITH GFR
Anion gap: 9 (ref 5–15)
BUN: 15 mg/dL (ref 8–23)
CO2: 26 mmol/L (ref 22–32)
Calcium: 9.9 mg/dL (ref 8.9–10.3)
Chloride: 107 mmol/L (ref 98–111)
Creatinine, Ser: 0.84 mg/dL (ref 0.44–1.00)
GFR calc Af Amer: >60 mL/min
GFR calc non Af Amer: >60 mL/min
Glucose, Bld: 137 mg/dL — ABNORMAL HIGH (ref 70–99)
Potassium: 4.7 mmol/L (ref 3.5–5.1)
Sodium: 142 mmol/L (ref 135–145)

## 2019-04-15 MED ORDER — CALCIUM CARBONATE ANTACID 500 MG PO CHEW: 2.0000 | CHEWABLE_TABLET | Freq: Three times a day (TID) | ORAL | 1 refills | Status: DC

## 2019-04-15 MED ORDER — LEVOTHYROXINE SODIUM 88 MCG PO TABS: 88.0000 ug | ORAL_TABLET | Freq: Every day | ORAL | 3 refills | Status: DC

## 2019-04-15 MED ORDER — TRAMADOL HCL 50 MG PO TABS: 50.0000 mg | ORAL_TABLET | Freq: Four times a day (QID) | ORAL | 0 refills | Status: DC | PRN

## 2019-04-15 NOTE — Plan of Care (Signed)

## 2019-04-15 NOTE — Plan of Care (Signed)
  Problem: Education: Goal: Knowledge of General Education information will improve Description: Including pain rating scale, medication(s)/side effects and non-pharmacologic comfort measures 04/15/2019 1102 by Hubert Azure, RN Outcome: Completed/Met 04/15/2019 1101 by Hubert Azure, RN Outcome: Progressing   Problem: Health Behavior/Discharge Planning: Goal: Ability to manage health-related needs will improve 04/15/2019 1102 by Hubert Azure, RN Outcome: Completed/Met 04/15/2019 1101 by Hubert Azure, RN Outcome: Progressing   Problem: Clinical Measurements: Goal: Ability to maintain clinical measurements within normal limits will improve 04/15/2019 1102 by Hubert Azure, RN Outcome: Completed/Met 04/15/2019 1101 by Hubert Azure, RN Outcome: Progressing Goal: Will remain free from infection 04/15/2019 1102 by Hubert Azure, RN Outcome: Completed/Met 04/15/2019 1101 by Hubert Azure, RN Outcome: Progressing Goal: Diagnostic test results will improve 04/15/2019 1102 by Hubert Azure, RN Outcome: Completed/Met 04/15/2019 1101 by Hubert Azure, RN Outcome: Progressing Goal: Respiratory complications will improve Outcome: Completed/Met Goal: Cardiovascular complication will be avoided Outcome: Completed/Met   Problem: Activity: Goal: Risk for activity intolerance will decrease Outcome: Completed/Met   Problem: Nutrition: Goal: Adequate nutrition will be maintained Outcome: Completed/Met   Problem: Coping: Goal: Level of anxiety will decrease Outcome: Completed/Met   Problem: Elimination: Goal: Will not experience complications related to bowel motility Outcome: Completed/Met Goal: Will not experience complications related to urinary retention Outcome: Completed/Met   Problem: Pain Managment: Goal: General experience of comfort will improve Outcome: Completed/Met   Problem: Safety: Goal: Ability to remain free from injury will improve Outcome:  Completed/Met   Problem: Skin Integrity: Goal: Risk for impaired skin integrity will decrease Outcome: Completed/Met

## 2019-04-15 NOTE — Discharge Summary (Signed)
Physician Discharge Summary [name]  Patient ID: Tammy Lawrence MRN: [id number] DOB/AGE: 73-Mar-1947 73 y.o.  Admit date: 04/14/2019 Discharge date: 04/15/2019  Admission Diagnoses:  Thyroid neoplasm of uncertain behavior, multiple thyroid nodules  Discharge Diagnoses:  Active Problems:   Neoplasm of uncertain behavior of thyroid gland   Discharged Condition: good  Hospital Course: Patient was admitted for observation following thyroid surgery.  Post op course was uncomplicated.  Pain was well controlled.  Tolerated diet.  Post op calcium level on morning following surgery was 9.9 mg/dl.  Patient was prepared for discharge home on POD#1.  Consults: None  Treatments: surgery: total thyroidectomy  Discharge Exam: Blood pressure 122/66, pulse 68, temperature 97.9 F (36.6 C), temperature source Oral, resp. rate 14, height 5' (1.524 m), weight 61.6 kg, SpO2 96 %. HEENT - clear Neck - wound dry and intact with Dermabond, mild STS, voice normal Chest - clear bilaterally Cor - RRR   Disposition: Home  Discharge Instructions    Diet - low sodium heart healthy   Complete by: As directed    Discharge instructions   Complete by: As directed    [name]  THYROID & PARATHYROID SURGERY:  POST-OP INSTRUCTIONS  Always review your discharge instruction sheet from the facility where your surgery was performed.  A prescription for pain medication may be given to you upon discharge.  Take your pain medication as prescribed.  If narcotic pain medicine is not needed, then you may take acetaminophen (Tylenol) or ibuprofen (Advil) as needed.  Take your usually prescribed medications unless otherwise directed.  If you need a refill on your pain medication, please contact our office during regular business hours.  Prescriptions cannot be processed by our office after 5 pm or on weekends.  Start with a light diet upon arrival home, such as  soup and crackers or toast.  Be sure to drink plenty of fluids daily.  Resume your normal diet the day after surgery.  Most patients will experience some swelling and bruising on the chest and neck area.  Ice packs will help.  Swelling and bruising can take several days to resolve.   It is common to experience some constipation after surgery.  Increasing fluid intake and taking a stool softener (Colace) will usually help or prevent this problem.  A mild laxative (Milk of Magnesia or Miralax) should be taken according to package directions if there has been no bowel movement after 48 hours.  You have steri-strips and a gauze dressing over your incision.  You may remove the gauze bandage on the second day after surgery, and you may shower at that time.  Leave your steri-strips (small skin tapes) in place directly over the incision.  These strips should remain on the skin for 5-7 days and then be removed.  You may get them wet in the shower and pat them dry.  You may resume regular (light) daily activities beginning the next day (such as daily self-care, walking, climbing stairs) gradually increasing activities as tolerated.  You may have sexual intercourse when it is comfortable.  Refrain from any heavy lifting or straining until approved by your doctor.  You may drive when you no longer are taking prescription pain medication, you can comfortably wear a seatbelt, and you can safely maneuver your car and apply brakes.  You should see your doctor in the office for a follow-up appointment approximately three weeks after your surgery.  Make sure that you  call for this appointment within a day or two after you arrive home to insure a convenient appointment time.  WHEN TO CALL YOUR DOCTOR: -- Fever greater than 101.5 -- Inability to urinate -- Nausea and/or vomiting - persistent -- Extreme swelling or bruising -- Continued bleeding from incision -- Increased pain, redness, or drainage from the  incision -- Difficulty swallowing or breathing -- Muscle cramping or spasms -- Numbness or tingling in hands or around lips  The clinic staff is available to answer your questions during regular business hours.  Please don't hesitate to call and ask to speak to one of the nurses if you have concerns.  Armandina Gemma, MD [name] Office: [phone]   Increase activity slowly   Complete by: As directed    No dressing needed   Complete by: As directed      Allergies as of 04/15/2019   No Known Allergies     Medication List    TAKE these medications   atorvastatin 40 MG tablet Commonly known as: LIPITOR Take 40 mg by mouth at bedtime.   calcium carbonate 500 MG chewable tablet Commonly known as: Tums Chew 2 tablets (400 mg of elemental calcium total) by mouth 3 (three) times daily. What changed:   how much to take  when to take this  reasons to take this   clotrimazole 1 % cream Commonly known as: LOTRIMIN Apply 1 application topically daily as needed (for skin irritation (under breasts)).   fenofibrate 145 MG tablet Commonly known as: TRICOR Take 145 mg by mouth at bedtime.   fluticasone 50 MCG/ACT nasal spray Commonly known as: FLONASE Place 1-2 sprays into both nostrils daily as needed for allergies.   GaviLyte-N with Flavor Pack 420 g solution Generic drug: polyethylene glycol-electrolytes Take 4,000 mLs by mouth once.   ibuprofen 200 MG tablet Commonly known as: ADVIL Take 200 mg by mouth every 8 (eight) hours as needed (for pain.).   levothyroxine 88 MCG tablet Commonly known as: Synthroid Take 1 tablet (88 mcg total) by mouth daily before breakfast.   multivitamin with minerals Tabs tablet Take 1 tablet by mouth daily. Centrum   traMADol 50 MG tablet Commonly known as: ULTRAM Take 1-2 tablets (50-100 mg total) by mouth every 6 (six) hours as needed.      Follow-up Information    Armandina Gemma, MD. Schedule an appointment as  soon as possible for a visit in 3 week(s).   Specialty: General Surgery Contact information: [address] [place] [address] [phone]           Earnstine Regal, MD, [name] Office: [phone]   Signed: Armandina Gemma 04/15/2019, 9:18 AM

## 2019-04-16 LAB — SURGICAL PATHOLOGY

## 2019-04-17 ENCOUNTER — Encounter: Payer: Self-pay | Admitting: *Deleted

## 2019-06-12 ENCOUNTER — Other Ambulatory Visit: Payer: Self-pay

## 2019-06-12 ENCOUNTER — Ambulatory Visit
Admission: RE | Admit: 2019-06-12 | Discharge: 2019-06-12 | Disposition: A | Discharge status: Home / Self Care | Payer: Medicare Other | Source: Ambulatory Visit | Attending: Family Medicine | Admitting: Family Medicine

## 2019-06-12 DIAGNOSIS — E2839 Other primary ovarian failure: Secondary | ICD-10-CM

## 2019-08-06 ENCOUNTER — Other Ambulatory Visit: Payer: Self-pay | Admitting: Family Medicine

## 2019-08-06 ENCOUNTER — Ambulatory Visit
Admission: RE | Admit: 2019-08-06 | Discharge: 2019-08-06 | Disposition: A | Discharge status: Home / Self Care | Payer: Medicare PPO | Source: Ambulatory Visit | Attending: Family Medicine | Admitting: Family Medicine

## 2019-08-06 DIAGNOSIS — K5792 Diverticulitis of intestine, part unspecified, without perforation or abscess without bleeding: Secondary | ICD-10-CM

## 2019-08-06 MED ORDER — IOPAMIDOL (ISOVUE-300) INJECTION 61%
100.0000 mL | Freq: Once | INTRAVENOUS | Status: AC | PRN
  Administered 2019-08-06: 13:00:00 100 mL via INTRAVENOUS

## 2019-10-17 DIAGNOSIS — C73 Malignant neoplasm of thyroid gland: Secondary | ICD-10-CM | POA: Diagnosis not present

## 2019-10-17 DIAGNOSIS — E890 Postprocedural hypothyroidism: Secondary | ICD-10-CM | POA: Diagnosis not present

## 2019-11-12 ENCOUNTER — Other Ambulatory Visit: Payer: Self-pay | Admitting: Family Medicine

## 2019-11-12 DIAGNOSIS — Z1231 Encounter for screening mammogram for malignant neoplasm of breast: Secondary | ICD-10-CM

## 2019-12-16 DIAGNOSIS — Z79899 Other long term (current) drug therapy: Secondary | ICD-10-CM | POA: Diagnosis not present

## 2019-12-16 DIAGNOSIS — E7800 Pure hypercholesterolemia, unspecified: Secondary | ICD-10-CM | POA: Diagnosis not present

## 2019-12-16 DIAGNOSIS — R61 Generalized hyperhidrosis: Secondary | ICD-10-CM | POA: Diagnosis not present

## 2019-12-16 DIAGNOSIS — R918 Other nonspecific abnormal finding of lung field: Secondary | ICD-10-CM | POA: Diagnosis not present

## 2019-12-16 DIAGNOSIS — E1169 Type 2 diabetes mellitus with other specified complication: Secondary | ICD-10-CM | POA: Diagnosis not present

## 2019-12-16 DIAGNOSIS — L989 Disorder of the skin and subcutaneous tissue, unspecified: Secondary | ICD-10-CM | POA: Diagnosis not present

## 2019-12-18 ENCOUNTER — Other Ambulatory Visit: Payer: Self-pay | Admitting: Family Medicine

## 2019-12-18 DIAGNOSIS — R918 Other nonspecific abnormal finding of lung field: Secondary | ICD-10-CM

## 2019-12-23 ENCOUNTER — Other Ambulatory Visit: Payer: Self-pay

## 2019-12-23 ENCOUNTER — Ambulatory Visit
Admission: RE | Admit: 2019-12-23 | Discharge: 2019-12-23 | Disposition: A | Discharge status: Home / Self Care | Payer: Medicare PPO | Source: Ambulatory Visit | Attending: Family Medicine | Admitting: Family Medicine

## 2019-12-23 DIAGNOSIS — Z1231 Encounter for screening mammogram for malignant neoplasm of breast: Secondary | ICD-10-CM | POA: Diagnosis not present

## 2019-12-26 DIAGNOSIS — X32XXXA Exposure to sunlight, initial encounter: Secondary | ICD-10-CM | POA: Diagnosis not present

## 2019-12-26 DIAGNOSIS — D225 Melanocytic nevi of trunk: Secondary | ICD-10-CM | POA: Diagnosis not present

## 2019-12-26 DIAGNOSIS — L570 Actinic keratosis: Secondary | ICD-10-CM | POA: Diagnosis not present

## 2019-12-26 DIAGNOSIS — L0202 Furuncle of face: Secondary | ICD-10-CM | POA: Diagnosis not present

## 2019-12-31 ENCOUNTER — Other Ambulatory Visit: Payer: Medicare PPO

## 2019-12-31 ENCOUNTER — Ambulatory Visit
Admission: RE | Admit: 2019-12-31 | Discharge: 2019-12-31 | Disposition: A | Discharge status: Home / Self Care | Payer: Medicare PPO | Source: Ambulatory Visit | Attending: Family Medicine | Admitting: Family Medicine

## 2019-12-31 DIAGNOSIS — R918 Other nonspecific abnormal finding of lung field: Secondary | ICD-10-CM

## 2019-12-31 DIAGNOSIS — I2510 Atherosclerotic heart disease of native coronary artery without angina pectoris: Secondary | ICD-10-CM | POA: Diagnosis not present

## 2019-12-31 DIAGNOSIS — E890 Postprocedural hypothyroidism: Secondary | ICD-10-CM | POA: Diagnosis not present

## 2019-12-31 DIAGNOSIS — R911 Solitary pulmonary nodule: Secondary | ICD-10-CM | POA: Diagnosis not present

## 2019-12-31 DIAGNOSIS — I700 Atherosclerosis of aorta: Secondary | ICD-10-CM | POA: Diagnosis not present

## 2020-01-20 DIAGNOSIS — E039 Hypothyroidism, unspecified: Secondary | ICD-10-CM | POA: Diagnosis not present

## 2020-02-01 IMAGING — US US FNA BIOPSY THYROID 1ST LESION
1 series · 13 of 14 positions shown · non-contrast
Comparison: Ultrasound done March 11, 2018

MEDICATIONS:
1% lidocaine 3 mL

COMPLICATIONS:
None immediate.

INDICATION: Indeterminate thyroid nodule

EXAM:
ULTRASOUND GUIDED FINE NEEDLE ASPIRATION OF INDETERMINATE THYROID
NODULE
TECHNIQUE: Informed written consent was obtained from the patient after a
discussion of the risks, benefits and alternatives to treatment.
Questions regarding the procedure were encouraged and answered. A
timeout was performed prior to the initiation of the procedure.

[Series 1: us fna biopsy thyroid 1st lesion · 0.06mm/px · 14 acquisitions, 13 frames shown]
[im 1/14]
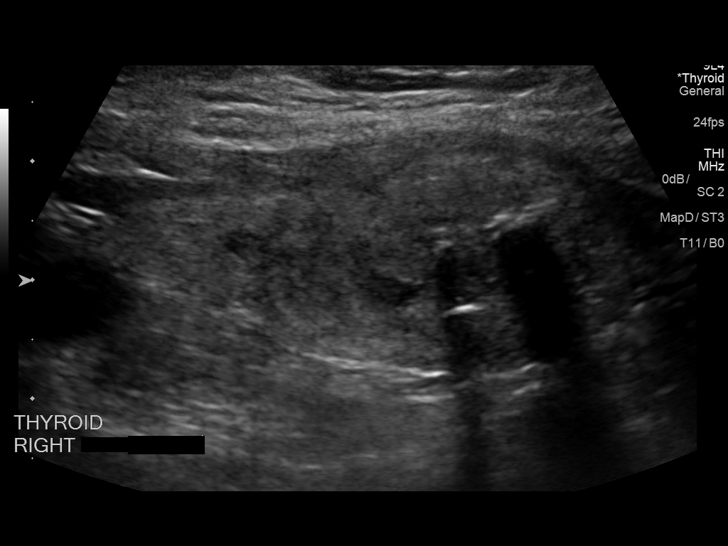
[im 2/14]
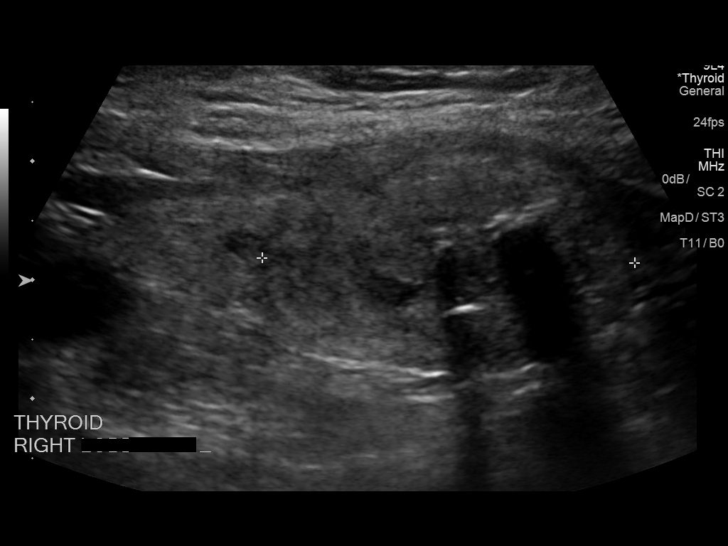
[im 3/14]
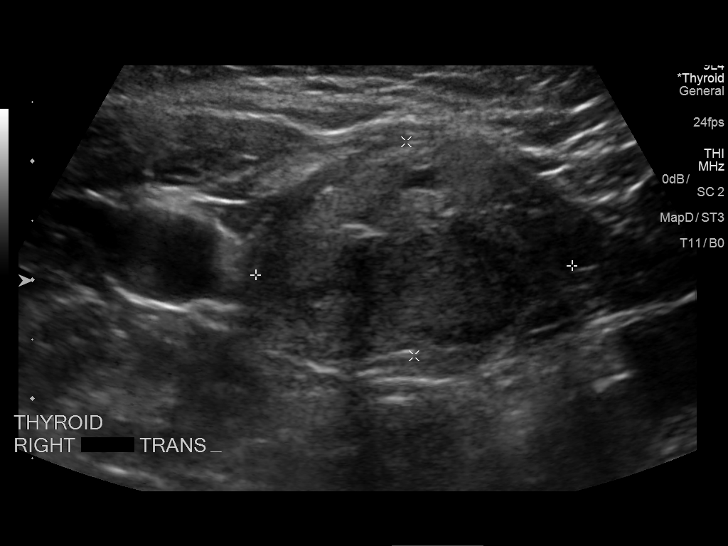
[im 4/14]
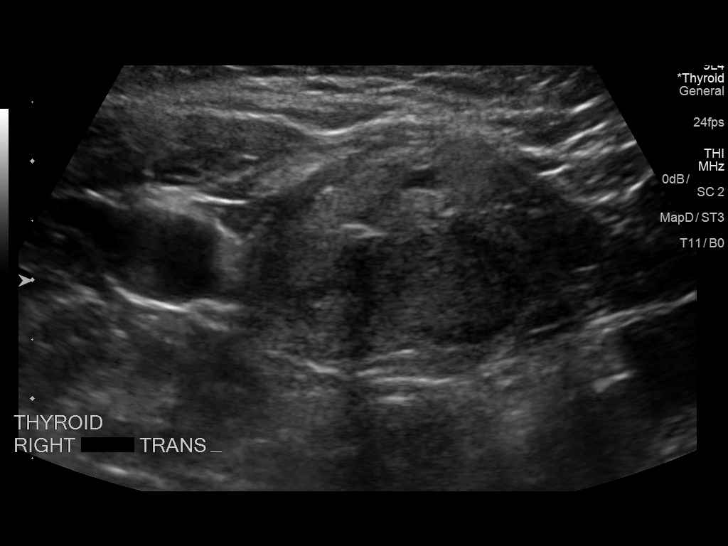
[im 5/14]
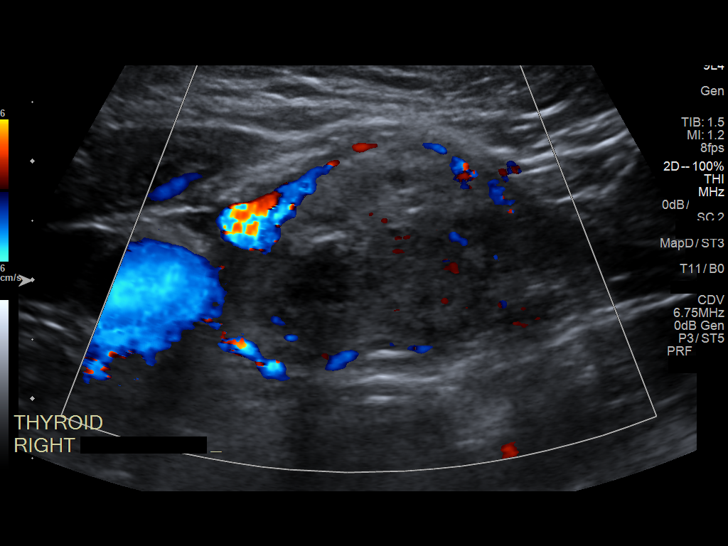
[im 6/14]
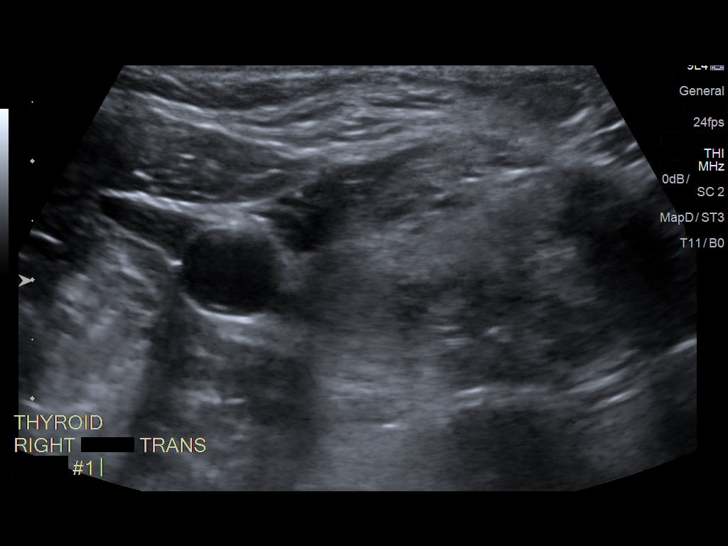
[im 8/14]
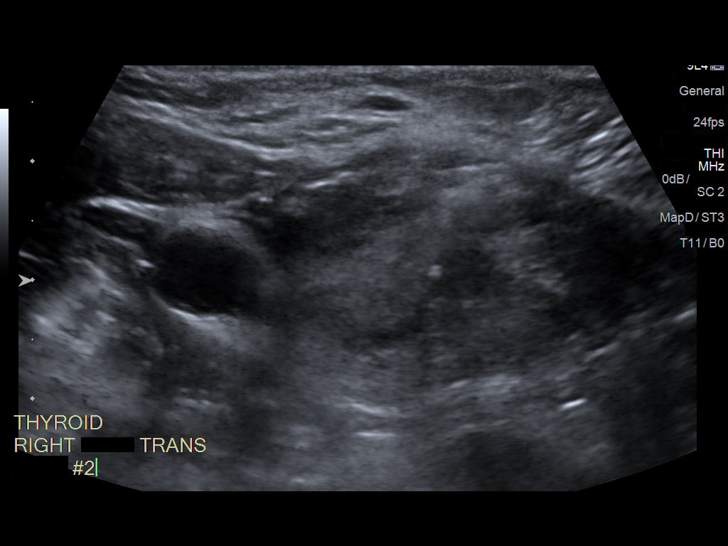
[im 9/14]
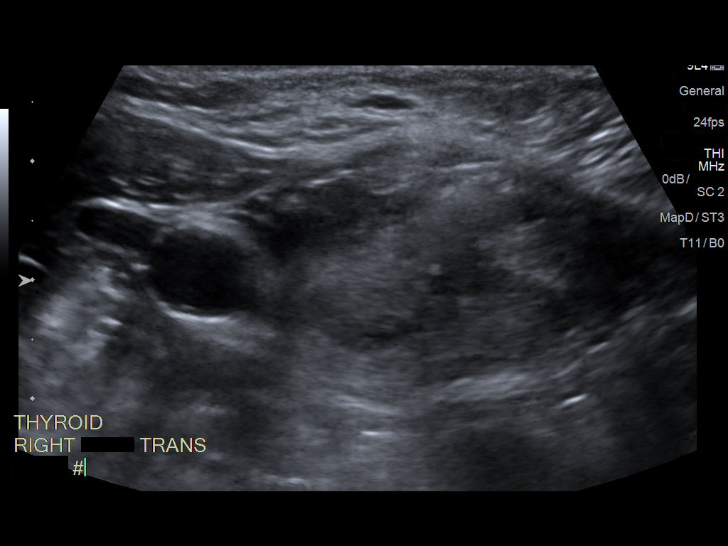
[im 10/14]
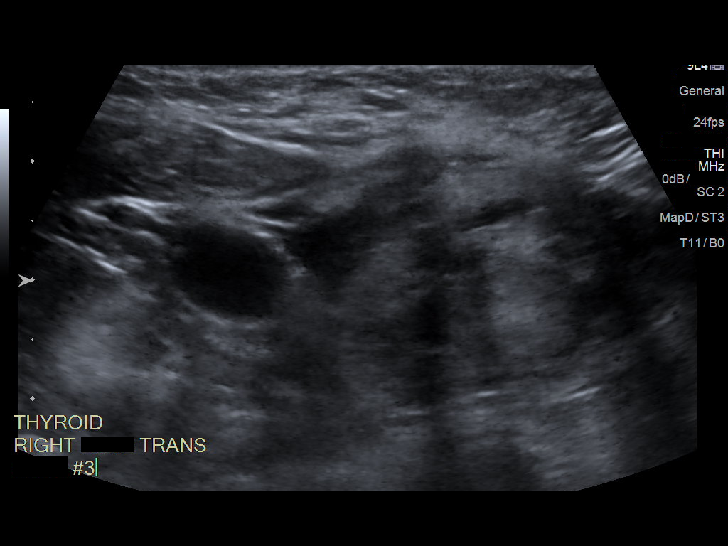
[im 11/14]
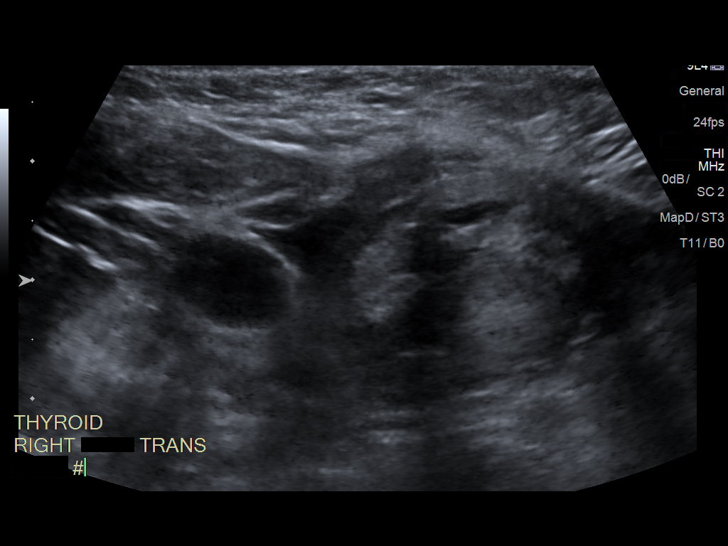
[im 12/14]
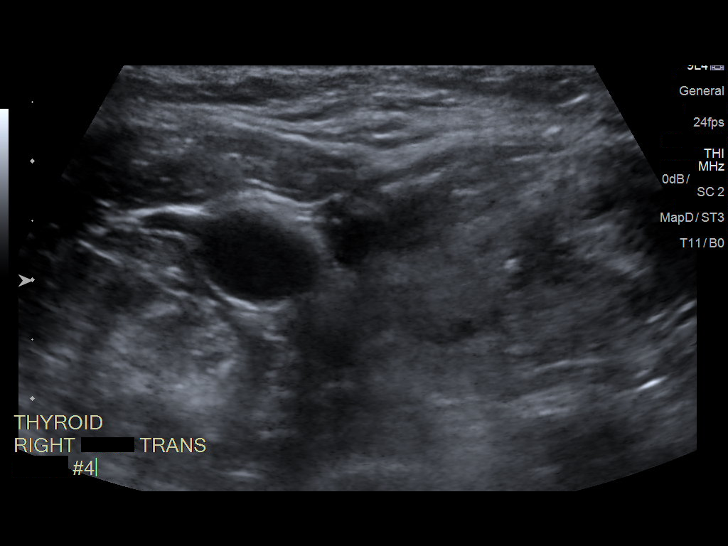
[im 13/14]
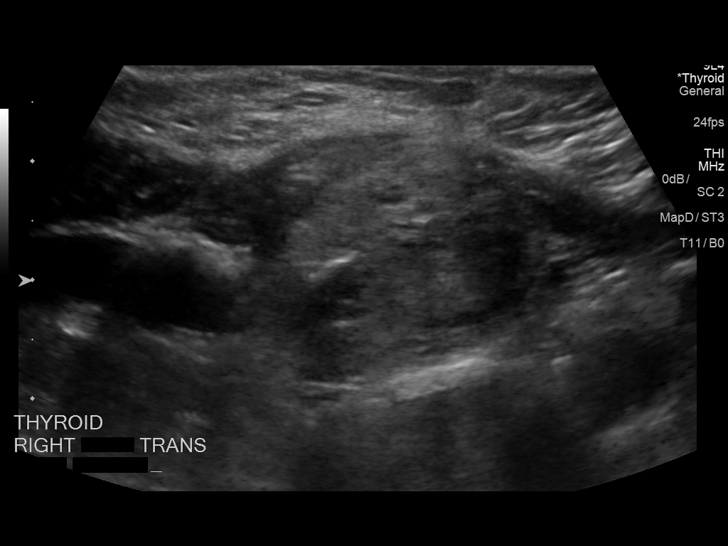
[im 14/14]
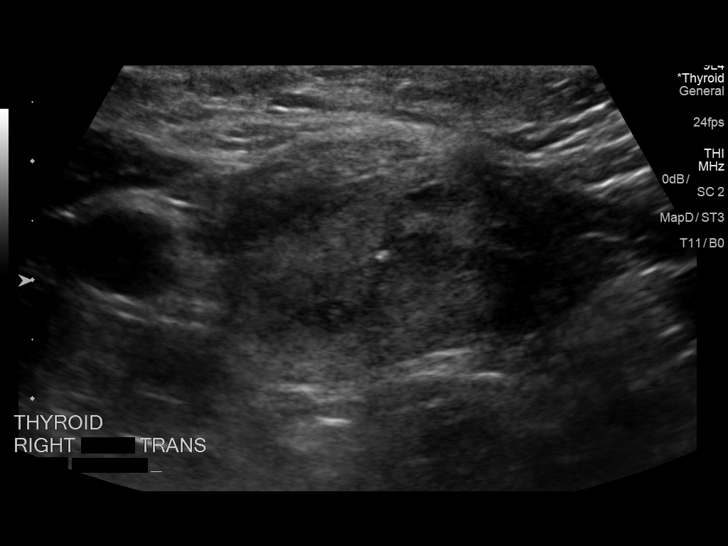

[13 of 14 positions shown; findings below may reference images not displayed]

Pre-procedural ultrasound scanning demonstrated unchanged size and
appearance of the indeterminate nodule within the right lobe of the
thyroid.

The procedure was planned. The neck was prepped in the usual sterile
fashion, and a sterile drape was applied covering the operative
field. A timeout was performed prior to the initiation of the
procedure. Local anesthesia was provided with 1% lidocaine.

Under direct ultrasound guidance, 4 FNA biopsies were performed of
the right inferior thyroid nodule with a 25 gauge needle. Multiple
ultrasound images were saved for procedural documentation purposes.
The samples were prepared and submitted to pathology.

Limited post procedural scanning was negative for hematoma or
additional complication. Dressings were placed. The patient
tolerated the above procedures procedure well without immediate
postprocedural complication.
FINDINGS: FINDINGS
Nodule reference number based on prior diagnostic ultrasound: 2

Maximum size: 3.3 cm

Location: Right  ;  Inferior

ACR TI-RADS risk category:  TR4

Reason for biopsy: meets ACR TI-RADS criteria

Ultrasound imaging confirms appropriate placement of the needles
within the thyroid nodule.
IMPRESSION: Technically successful ultrasound guided fine needle aspiration of
right inferior thyroid nodule.

## 2020-03-15 DIAGNOSIS — E785 Hyperlipidemia, unspecified: Secondary | ICD-10-CM | POA: Diagnosis not present

## 2020-03-15 DIAGNOSIS — R7301 Impaired fasting glucose: Secondary | ICD-10-CM | POA: Diagnosis not present

## 2020-03-15 DIAGNOSIS — K7581 Nonalcoholic steatohepatitis (NASH): Secondary | ICD-10-CM | POA: Diagnosis not present

## 2020-03-15 DIAGNOSIS — R7303 Prediabetes: Secondary | ICD-10-CM | POA: Diagnosis not present

## 2020-03-15 DIAGNOSIS — Z85850 Personal history of malignant neoplasm of thyroid: Secondary | ICD-10-CM | POA: Diagnosis not present

## 2020-03-15 DIAGNOSIS — E039 Hypothyroidism, unspecified: Secondary | ICD-10-CM | POA: Diagnosis not present

## 2020-03-15 DIAGNOSIS — E890 Postprocedural hypothyroidism: Secondary | ICD-10-CM | POA: Diagnosis not present

## 2020-03-15 DIAGNOSIS — E781 Pure hyperglyceridemia: Secondary | ICD-10-CM | POA: Diagnosis not present

## 2020-03-15 DIAGNOSIS — Z720 Tobacco use: Secondary | ICD-10-CM | POA: Diagnosis not present

## 2020-03-16 DIAGNOSIS — K760 Fatty (change of) liver, not elsewhere classified: Secondary | ICD-10-CM | POA: Diagnosis not present

## 2020-03-16 DIAGNOSIS — E7800 Pure hypercholesterolemia, unspecified: Secondary | ICD-10-CM | POA: Diagnosis not present

## 2020-03-16 DIAGNOSIS — I10 Essential (primary) hypertension: Secondary | ICD-10-CM | POA: Diagnosis not present

## 2020-03-16 DIAGNOSIS — R1032 Left lower quadrant pain: Secondary | ICD-10-CM | POA: Diagnosis not present

## 2020-03-16 DIAGNOSIS — Z79899 Other long term (current) drug therapy: Secondary | ICD-10-CM | POA: Diagnosis not present

## 2020-03-16 DIAGNOSIS — E559 Vitamin D deficiency, unspecified: Secondary | ICD-10-CM | POA: Diagnosis not present

## 2020-03-16 DIAGNOSIS — Z23 Encounter for immunization: Secondary | ICD-10-CM | POA: Diagnosis not present

## 2020-03-16 DIAGNOSIS — Z0001 Encounter for general adult medical examination with abnormal findings: Secondary | ICD-10-CM | POA: Diagnosis not present

## 2020-03-16 DIAGNOSIS — E1169 Type 2 diabetes mellitus with other specified complication: Secondary | ICD-10-CM | POA: Diagnosis not present

## 2020-04-07 ENCOUNTER — Other Ambulatory Visit: Payer: Self-pay | Admitting: Internal Medicine

## 2020-04-07 DIAGNOSIS — E039 Hypothyroidism, unspecified: Secondary | ICD-10-CM

## 2020-04-07 DIAGNOSIS — Z85850 Personal history of malignant neoplasm of thyroid: Secondary | ICD-10-CM

## 2020-04-22 ENCOUNTER — Other Ambulatory Visit: Payer: Medicare PPO

## 2020-05-03 ENCOUNTER — Ambulatory Visit
Admission: RE | Admit: 2020-05-03 | Discharge: 2020-05-03 | Disposition: A | Discharge status: Home / Self Care | Payer: Medicare PPO | Source: Ambulatory Visit | Attending: Internal Medicine | Admitting: Internal Medicine

## 2020-05-03 DIAGNOSIS — E039 Hypothyroidism, unspecified: Secondary | ICD-10-CM | POA: Diagnosis not present

## 2020-05-03 DIAGNOSIS — Z85850 Personal history of malignant neoplasm of thyroid: Secondary | ICD-10-CM

## 2020-06-07 DIAGNOSIS — M542 Cervicalgia: Secondary | ICD-10-CM | POA: Diagnosis not present

## 2020-06-07 DIAGNOSIS — M25511 Pain in right shoulder: Secondary | ICD-10-CM | POA: Diagnosis not present

## 2020-06-15 DIAGNOSIS — M5412 Radiculopathy, cervical region: Secondary | ICD-10-CM | POA: Diagnosis not present

## 2020-06-15 DIAGNOSIS — M7541 Impingement syndrome of right shoulder: Secondary | ICD-10-CM | POA: Diagnosis not present

## 2020-06-18 DIAGNOSIS — M7541 Impingement syndrome of right shoulder: Secondary | ICD-10-CM | POA: Diagnosis not present

## 2020-06-18 DIAGNOSIS — M5412 Radiculopathy, cervical region: Secondary | ICD-10-CM | POA: Diagnosis not present

## 2020-06-22 DIAGNOSIS — M7541 Impingement syndrome of right shoulder: Secondary | ICD-10-CM | POA: Diagnosis not present

## 2020-06-22 DIAGNOSIS — M5412 Radiculopathy, cervical region: Secondary | ICD-10-CM | POA: Diagnosis not present

## 2020-06-25 DIAGNOSIS — M7541 Impingement syndrome of right shoulder: Secondary | ICD-10-CM | POA: Diagnosis not present

## 2020-06-25 DIAGNOSIS — M5412 Radiculopathy, cervical region: Secondary | ICD-10-CM | POA: Diagnosis not present

## 2020-06-29 DIAGNOSIS — M5412 Radiculopathy, cervical region: Secondary | ICD-10-CM | POA: Diagnosis not present

## 2020-06-29 DIAGNOSIS — M7541 Impingement syndrome of right shoulder: Secondary | ICD-10-CM | POA: Diagnosis not present

## 2020-07-02 DIAGNOSIS — M7541 Impingement syndrome of right shoulder: Secondary | ICD-10-CM | POA: Diagnosis not present

## 2020-07-02 DIAGNOSIS — M5412 Radiculopathy, cervical region: Secondary | ICD-10-CM | POA: Diagnosis not present

## 2020-07-05 DIAGNOSIS — M7541 Impingement syndrome of right shoulder: Secondary | ICD-10-CM | POA: Diagnosis not present

## 2020-07-05 DIAGNOSIS — M79601 Pain in right arm: Secondary | ICD-10-CM | POA: Diagnosis not present

## 2020-07-05 DIAGNOSIS — M5412 Radiculopathy, cervical region: Secondary | ICD-10-CM | POA: Diagnosis not present

## 2020-07-07 DIAGNOSIS — M5412 Radiculopathy, cervical region: Secondary | ICD-10-CM | POA: Diagnosis not present

## 2020-07-07 DIAGNOSIS — M7541 Impingement syndrome of right shoulder: Secondary | ICD-10-CM | POA: Diagnosis not present

## 2020-09-14 IMAGING — CT CT CHEST W/O CM
2 of 4 series · 15 of 36 positions shown, 18 images · non-contrast
Comparison: None.

CLINICAL DATA: Productive cough, chest pain and shortness of breath
on exertion. Current smoker.

EXAM:
CT CHEST WITHOUT CONTRAST
TECHNIQUE: Multidetector CT imaging of the chest was performed following the
standard protocol without IV contrast.

[Series 2: chest 2.00 br40 s3 ax · axial · 0.63mm/px · z∈[+1439,+1685]mm · 12 of 147 slices shown, 15 images]
[im 12/147  mediastinal]
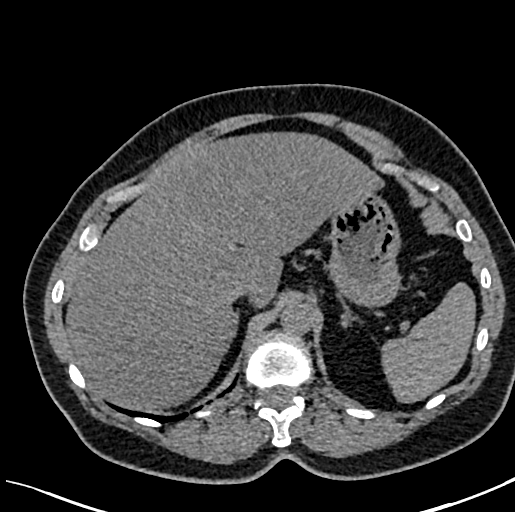
[im 12/147  lung]
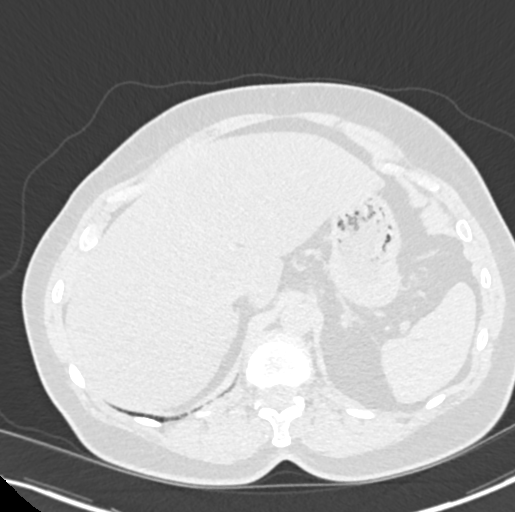
[im 23/147  lung]
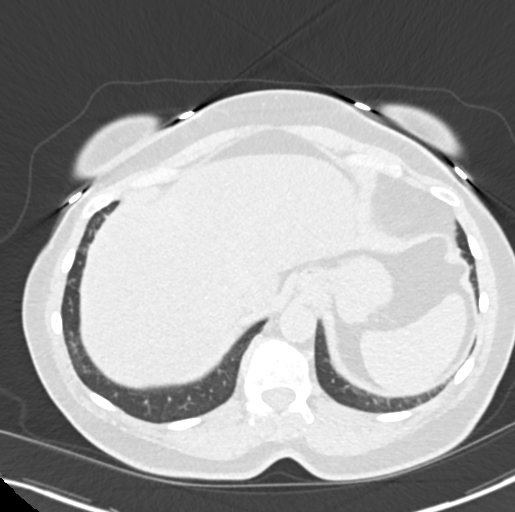
[im 34/147  lung]
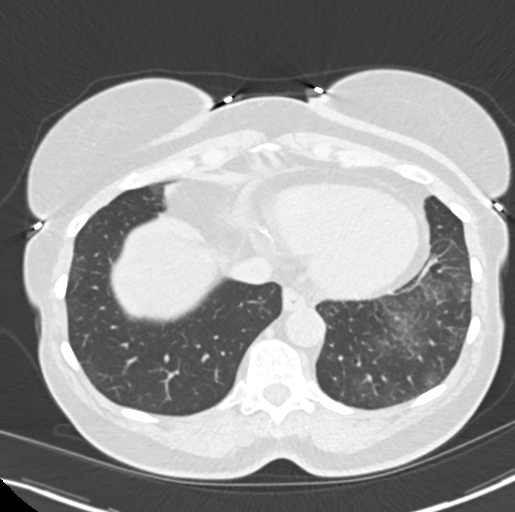
[im 45/147  lung]
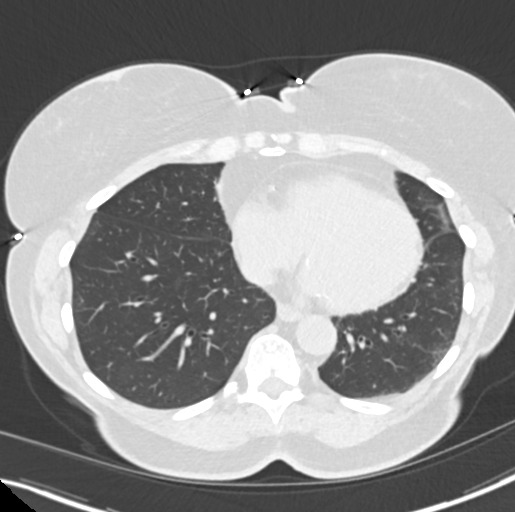
[im 57/147  mediastinal]
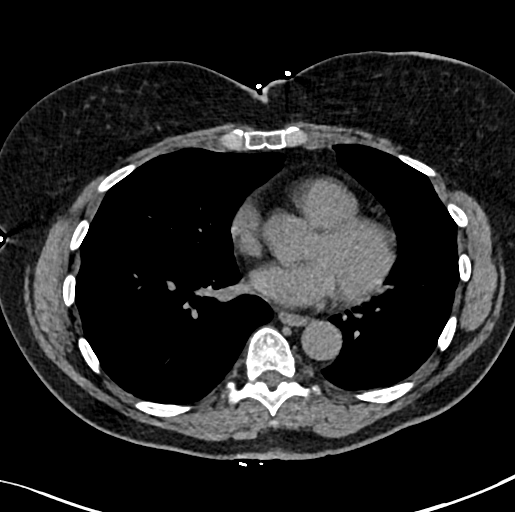
[im 57/147  lung]
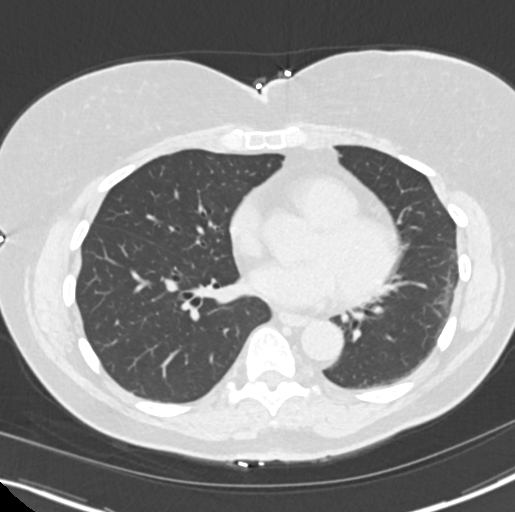
[im 68/147  lung]
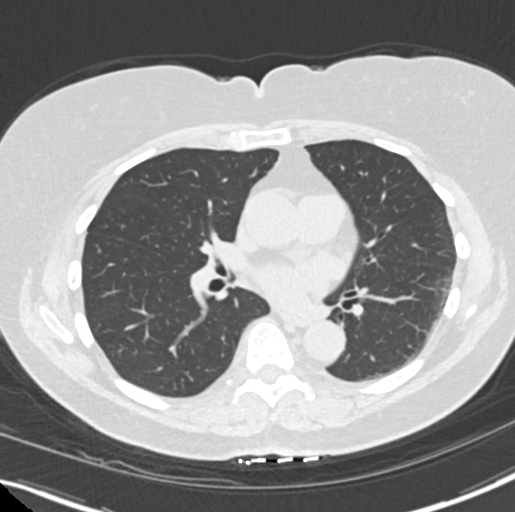
[im 79/147  lung]
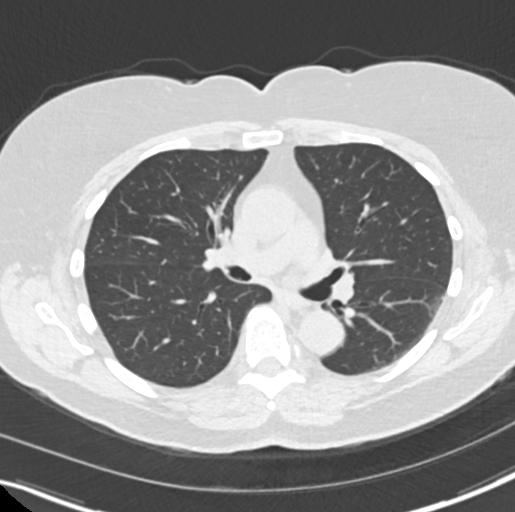
[im 90/147  lung]
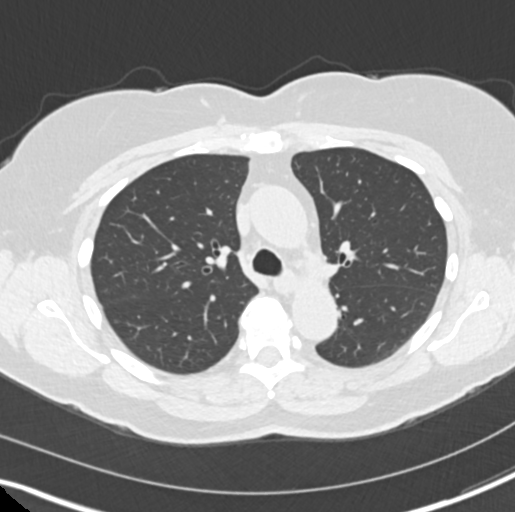
[im 102/147  mediastinal]
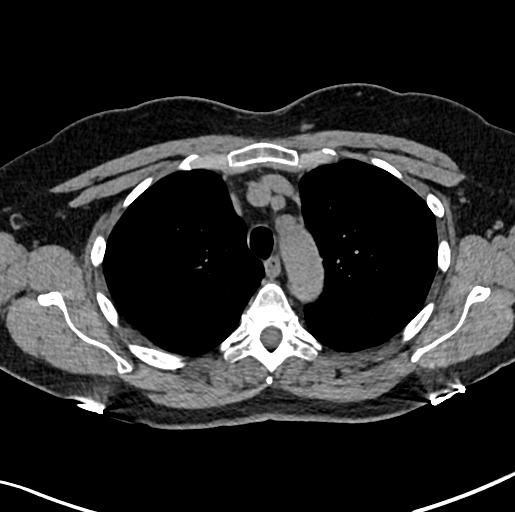
[im 102/147  lung]
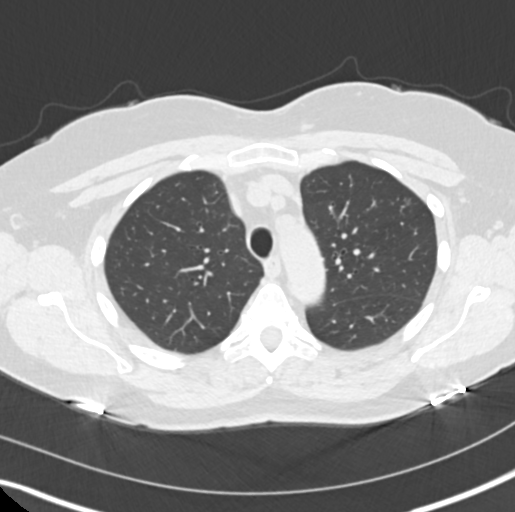
[im 113/147  lung]
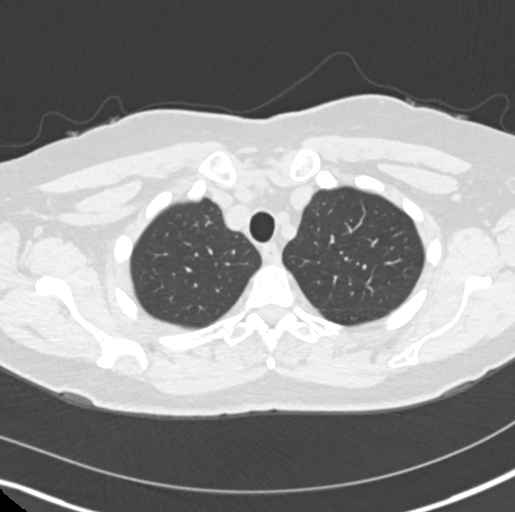
[im 124/147  lung]
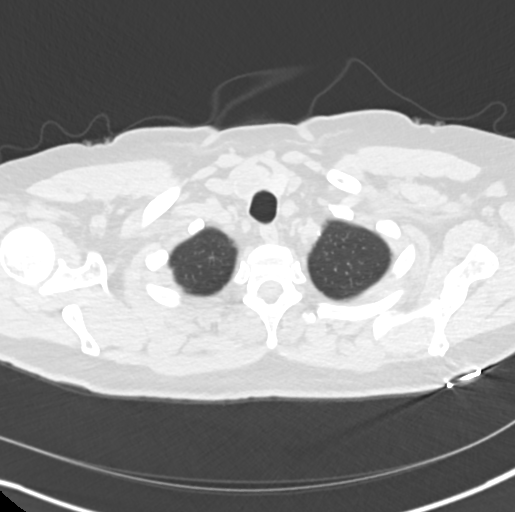
[im 135/147  lung]
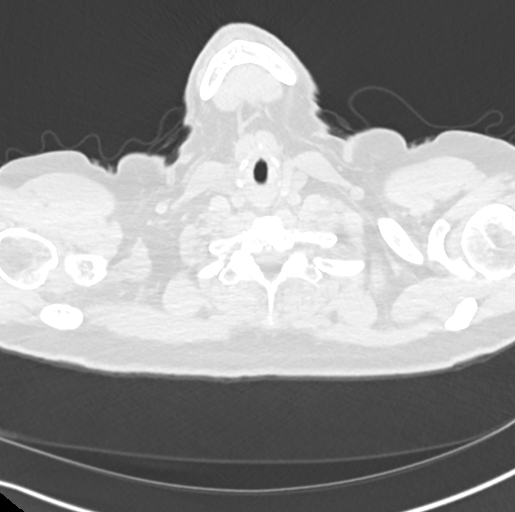

[Series 4: chest 2.00 br40 s3 cor · coronal · 0.58mm/px · 3 of 160 slices shown]
[im 32/160  lung]
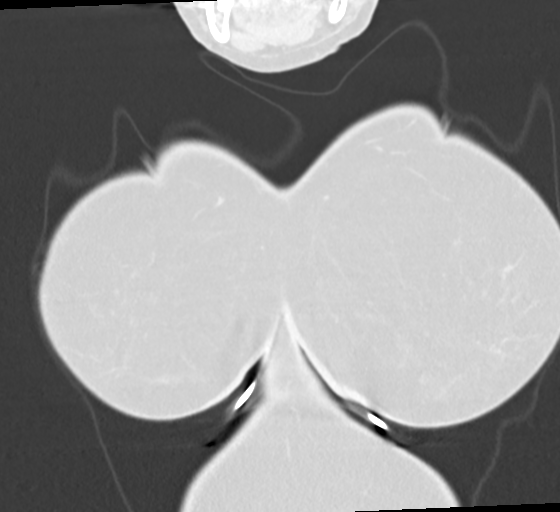
[im 64/160  lung]
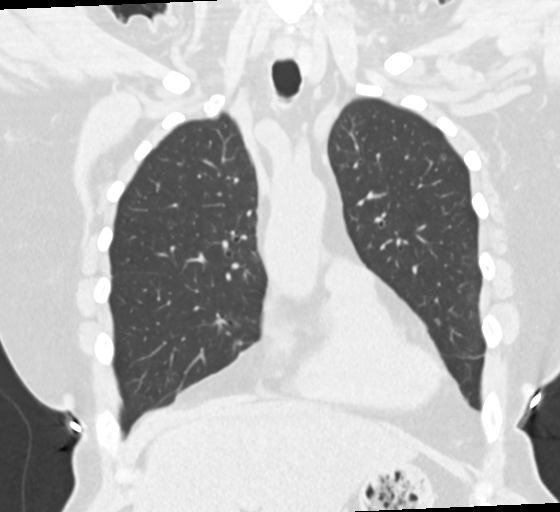
[im 96/160  lung]
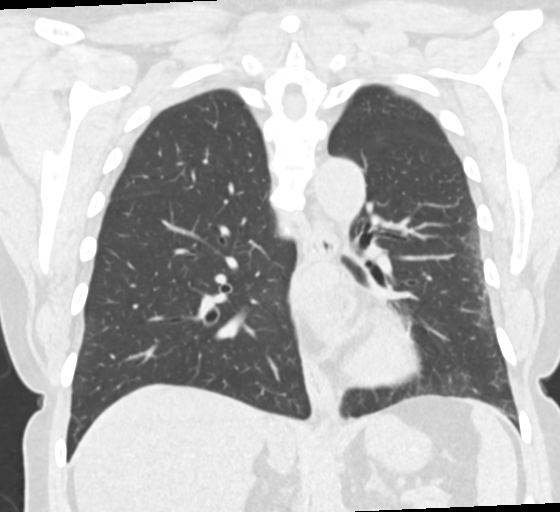

[15 of 36 positions shown; findings below may reference images not displayed]

FINDINGS: Cardiovascular: Atherosclerotic calcification of the arterial
vasculature, including coronary arteries. Heart size normal. No
pericardial effusion.

Mediastinum/Nodes: Low-attenuation nodule in the thyroid isthmus
measures 2.4 cm. No pathologically enlarged mediastinal or axillary
lymph nodes. Esophagus is grossly unremarkable.

Lungs/Pleura: Mild subpleural and parenchymal ground-glass in the
left lower lobe may be due to post infectious scarring. Scattered
pulmonary nodules measure 3 mm or less in size. There are faint foci
of peribronchovascular ground-glass in the left upper lobe. No
pleural fluid. Airway is unremarkable.

Upper Abdomen: Visualized portion of the liver is decreased in
attenuation diffusely. Visualized portions of the adrenal glands,
kidneys, spleen, pancreas and stomach are grossly unremarkable.

Musculoskeletal: Degenerative changes in the spine. No worrisome
lytic or sclerotic lesions.
IMPRESSION: 1. Favor mild post infectious or post inflammatory scarring in the
left lower lobe. Otherwise, no pulmonary parenchymal findings to
explain the patient's symptoms.
2. Scattered pulmonary nodules measure 3 mm or less in size. Foci of
peribronchovascular ground-glass in the left upper lobe, likely
infectious or inflammatory in etiology. Given patient's increased
risk for lung cancer, follow-up in 1 year is recommended. This
recommendation follows the consensus statement: Guidelines for
Management of Small Pulmonary Nodules Detected on CT Images: From
the [HOSPITAL] 1571; Radiology 1571; [DATE].
3. Thyroid nodule. Consider further evaluation with thyroid
ultrasound. If patient is clinically hyperthyroid, consider nuclear
medicine thyroid uptake and scan.
4. Hepatic steatosis.
5. Aortic atherosclerosis (I0G26-170.0). Coronary artery
calcification.

## 2020-11-17 ENCOUNTER — Other Ambulatory Visit: Payer: Self-pay | Admitting: Family Medicine

## 2020-11-17 DIAGNOSIS — Z1231 Encounter for screening mammogram for malignant neoplasm of breast: Secondary | ICD-10-CM

## 2020-12-23 ENCOUNTER — Other Ambulatory Visit: Payer: Self-pay

## 2020-12-23 ENCOUNTER — Ambulatory Visit
Admission: RE | Admit: 2020-12-23 | Discharge: 2020-12-23 | Disposition: A | Discharge status: Home / Self Care | Payer: Medicare PPO | Source: Ambulatory Visit | Attending: Family Medicine | Admitting: Family Medicine

## 2020-12-23 DIAGNOSIS — Z1231 Encounter for screening mammogram for malignant neoplasm of breast: Secondary | ICD-10-CM | POA: Diagnosis not present

## 2021-01-28 DIAGNOSIS — D2372 Other benign neoplasm of skin of left lower limb, including hip: Secondary | ICD-10-CM | POA: Diagnosis not present

## 2021-01-28 DIAGNOSIS — L408 Other psoriasis: Secondary | ICD-10-CM | POA: Diagnosis not present

## 2021-03-11 DIAGNOSIS — L400 Psoriasis vulgaris: Secondary | ICD-10-CM | POA: Diagnosis not present

## 2021-03-11 DIAGNOSIS — L409 Psoriasis, unspecified: Secondary | ICD-10-CM | POA: Diagnosis not present

## 2021-04-01 DIAGNOSIS — I700 Atherosclerosis of aorta: Secondary | ICD-10-CM | POA: Diagnosis not present

## 2021-04-01 DIAGNOSIS — K760 Fatty (change of) liver, not elsewhere classified: Secondary | ICD-10-CM | POA: Diagnosis not present

## 2021-04-01 DIAGNOSIS — E559 Vitamin D deficiency, unspecified: Secondary | ICD-10-CM | POA: Diagnosis not present

## 2021-04-01 DIAGNOSIS — I10 Essential (primary) hypertension: Secondary | ICD-10-CM | POA: Diagnosis not present

## 2021-04-01 DIAGNOSIS — Z23 Encounter for immunization: Secondary | ICD-10-CM | POA: Diagnosis not present

## 2021-04-01 DIAGNOSIS — E1169 Type 2 diabetes mellitus with other specified complication: Secondary | ICD-10-CM | POA: Diagnosis not present

## 2021-04-01 DIAGNOSIS — Z0001 Encounter for general adult medical examination with abnormal findings: Secondary | ICD-10-CM | POA: Diagnosis not present

## 2021-04-01 DIAGNOSIS — E7800 Pure hypercholesterolemia, unspecified: Secondary | ICD-10-CM | POA: Diagnosis not present

## 2021-04-01 DIAGNOSIS — Z79899 Other long term (current) drug therapy: Secondary | ICD-10-CM | POA: Diagnosis not present

## 2021-05-12 ENCOUNTER — Other Ambulatory Visit: Payer: Self-pay | Admitting: *Deleted

## 2021-05-12 DIAGNOSIS — F17210 Nicotine dependence, cigarettes, uncomplicated: Secondary | ICD-10-CM

## 2021-05-12 DIAGNOSIS — Z87891 Personal history of nicotine dependence: Secondary | ICD-10-CM

## 2021-05-18 ENCOUNTER — Other Ambulatory Visit: Payer: Self-pay

## 2021-05-18 ENCOUNTER — Ambulatory Visit (INDEPENDENT_AMBULATORY_CARE_PROVIDER_SITE_OTHER): Payer: Medicare PPO | Admitting: Acute Care

## 2021-05-18 ENCOUNTER — Encounter: Payer: Self-pay | Admitting: Acute Care

## 2021-05-18 DIAGNOSIS — F17210 Nicotine dependence, cigarettes, uncomplicated: Secondary | ICD-10-CM | POA: Diagnosis not present

## 2021-05-18 NOTE — Patient Instructions (Addendum)
Thank you for participating in the [facility] Scre[facility] Program. °It was our pleasure to meet you today. °We will call you with the results of your scan within the next few days. °Your scan will be assigned a Lung RADS category score by the physicians reading the scans.  °This Lung RADS score determines follow up scanning.  °See below for description of categories, and follow up screening recommendations. °We will be in touch to schedule your follow up screening annually or based on recommendations of our providers. °We will fax a copy of your scan results to your Primary Care Physician, or the physician who referred you to the program, to ensure they have the results. °Please call the office if you have any questions or concerns regarding your scanning experience or results.  °Our office number is [phone]. °Please speak with Denise Phelps, RN. She is our Lung Cancer Screening RN. °If she is unavailable when you call, please have the office staff send her a message. She will return your call at her earliest convenience. °Remember, if your scan is normal, we will scan you annually as long as you continue to meet the criteria for the program. (Age 55-77, Current smoker or smoker who has quit within the last 15 years). °If you are a smoker, remember, quitting is the single most powerful action that you can take to decrease your risk of lung cancer and other pulmonary, breathing related problems. °We know quitting is hard, and we are here to help.  °Please let us know if there is anything we can do to help you meet your goal of quitting. °If you are a former smoker, congratulations. We are proud of you! Remain smoke free! °Remember you can refer friends or family members through the number above.  °We will screen them to make sure they meet criteria for the program. °Thank you for helping us take better care of you by participating in Lung Screening. ° °You can receive free nicotine replacement therapy  ( patches, gum or mints) by calling 1-800-QUIT NOW. Please call so we can get you on the path to becoming  a non-smoker. I know it is hard, but you can do this! ° °Lung RADS Categories: ° °Lung RADS 1: no nodules or definitely non-concerning nodules.  °Recommendation is for a repeat annual scan in 12 months. ° °Lung RADS 2:  nodules that are non-concerning in appearance and behavior with a very low likelihood of becoming an active cancer. °Recommendation is for a repeat annual scan in 12 months. ° °Lung RADS 3: nodules that are probably non-concerning , includes nodules with a low likelihood of becoming an active cancer.  Recommendation is for a 6-month repeat screening scan. Often noted after an upper respiratory illness. We will be in touch to make sure you have no questions, and to schedule your 6-month scan. ° °Lung RADS 4 A: nodules with concerning findings, recommendation is most often for a follow up scan in 3 months or additional testing based on our provider's assessment of the scan. We will be in touch to make sure you have no questions and to schedule the recommended 3 month follow up scan. ° °Lung RADS 4 B:  indicates findings that are concerning. We will be in touch with you to schedule additional diagnostic testing based on our provider's  assessment of the scan. ° °Hypnosis for smoking cessation  °[employer] °[phone] ° °Acupuncture for smoking cessation  °[facility] °[phone]  °

## 2021-05-18 NOTE — Progress Notes (Signed)
Virtual Visit via Telephone Note  I connected with Tammy Lawrence on 05/18/21 at  2:30 PM EST by telephone and verified that I am speaking with the correct person using two identifiers.  Location: Patient:  At home Provider: [facility], [place], [place], [address]    I discussed the limitations, risks, security and privacy concerns of performing an evaluation and management service by telephone and the availability of in person appointments. I also discussed with the patient that there may be a patient responsible charge related to this service. The patient expressed understanding and agreed to proceed.   Shared Decision Making Visit Lung Cancer Screening Program [phone])   Eligibility: Age 76 y.o. Pack Years Smoking History Calculation 25 pack year smoking history (# packs/per year x # years smoked) Recent History of coughing up blood  no Unexplained weight loss? no ( >Than 15 pounds within the last 6 months ) Prior History Lung / other cancer no (Diagnosis within the last 5 years already requiring surveillance chest CT Scans). Smoking Status Current Smoker Former Smokers: Years since quit:  NA  Quit Date:  NA  Visit Components: Discussion included one or more decision making aids. yes Discussion included risk/benefits of screening. yes Discussion included potential follow up diagnostic testing for abnormal scans. yes Discussion included meaning and risk of over diagnosis. yes Discussion included meaning and risk of False Positives. yes Discussion included meaning of total radiation exposure. yes  Counseling Included: Importance of adherence to annual lung cancer LDCT screening. yes Impact of comorbidities on ability to participate in the program. yes Ability and willingness to under diagnostic treatment. yes  Smoking Cessation Counseling: Current Smokers:  Discussed importance of smoking cessation. yes Information about tobacco cessation classes and  interventions provided to patient. yes Patient provided with "ticket" for LDCT Scan. yes Symptomatic Patient. no  Counseling NA Diagnosis Code: Tobacco Use Z72.0 Asymptomatic Patient yes  Counseling (Intermediate counseling: > three minutes counseling) [id number] Former Smokers:  Discussed the importance of maintaining cigarette abstinence. yes Diagnosis Code: Personal History of Nicotine Dependence. K16.010 Information about tobacco cessation classes and interventions provided to patient. Yes Patient provided with "ticket" for LDCT Scan. yes Written Order for Lung Cancer Screening with LDCT placed in Epic. Yes (CT Chest Lung Cancer Screening Low Dose W/O CM) [id number] Z12.2-Screening of respiratory organs Z87.891-Personal history of nicotine dependence  I have spent 25 minutes of face to face/ virtual visit   time with  Tammy Lawrence discussing the risks and benefits of lung cancer screening. We viewed / discussed a power point together that explained in detail the above noted topics. We paused at intervals to allow for questions to be asked and answered to ensure understanding.We discussed that the single most powerful action that she can take to decrease her risk of developing lung cancer is to quit smoking. We discussed whether or not she is ready to commit to setting a quit date. We discussed options for tools to aid in quitting smoking including nicotine replacement therapy, non-nicotine medications, support groups, Quit Smart classes, and behavior modification. We discussed that often times setting smaller, more achievable goals, such as eliminating 1 cigarette a day for a week and then 2 cigarettes a day for a week can be helpful in slowly decreasing the number of cigarettes smoked. This allows for a sense of accomplishment as well as providing a clinical benefit. I provided  her  with smoking cessation  information  with contact information for community resources, classes,  free nicotine replacement  therapy, and access to mobile apps, text messaging, and on-line smoking cessation help. I have also provided  her  the office contact information in the event she needs to contact me, or the screening staff. We discussed the time and location of the scan, and that either Doroteo Glassman RN, Joella Prince, RN  or I will call / send a letter with the results within 24-72 hours of receiving them. The patient verbalized understanding of all of  the above and had no further questions upon leaving the office. They have my contact information in the event they have any further questions.  I spent 3 minutes counseling on smoking cessation and the health risks of continued tobacco abuse.  I explained to the patient that there has been a high incidence of coronary artery disease noted on these exams. I explained that this is a non-gated exam therefore degree or severity cannot be determined. This patient is not on statin therapy. I have asked the patient to follow-up with their PCP regarding any incidental finding of coronary artery disease and management with diet or medication as their PCP  feels is clinically indicated. The patient verbalized understanding of the above and had no further questions upon completion of the visit.      Magdalen Spatz, NP 05/18/2021

## 2021-05-20 ENCOUNTER — Other Ambulatory Visit: Payer: Self-pay

## 2021-05-20 ENCOUNTER — Ambulatory Visit (INDEPENDENT_AMBULATORY_CARE_PROVIDER_SITE_OTHER)
Admission: RE | Admit: 2021-05-20 | Discharge: 2021-05-20 | Disposition: A | Discharge status: Home / Self Care | Payer: Medicare PPO | Source: Ambulatory Visit | Attending: Acute Care | Admitting: Acute Care

## 2021-05-20 DIAGNOSIS — F17210 Nicotine dependence, cigarettes, uncomplicated: Secondary | ICD-10-CM

## 2021-05-20 DIAGNOSIS — Z87891 Personal history of nicotine dependence: Secondary | ICD-10-CM | POA: Diagnosis not present

## 2021-05-24 ENCOUNTER — Other Ambulatory Visit: Payer: Self-pay

## 2021-05-24 DIAGNOSIS — Z87891 Personal history of nicotine dependence: Secondary | ICD-10-CM

## 2021-05-24 DIAGNOSIS — F17210 Nicotine dependence, cigarettes, uncomplicated: Secondary | ICD-10-CM

## 2021-05-30 ENCOUNTER — Telehealth: Payer: Self-pay | Admitting: Acute Care

## 2021-05-31 NOTE — Telephone Encounter (Signed)
Called and spoke with pt and got her medication list updated. Nothing further needed.

## 2021-10-19 DIAGNOSIS — F17200 Nicotine dependence, unspecified, uncomplicated: Secondary | ICD-10-CM | POA: Diagnosis not present

## 2021-10-19 DIAGNOSIS — E890 Postprocedural hypothyroidism: Secondary | ICD-10-CM | POA: Diagnosis not present

## 2021-10-19 DIAGNOSIS — Z85850 Personal history of malignant neoplasm of thyroid: Secondary | ICD-10-CM | POA: Diagnosis not present

## 2021-10-19 DIAGNOSIS — E039 Hypothyroidism, unspecified: Secondary | ICD-10-CM | POA: Diagnosis not present

## 2021-11-16 ENCOUNTER — Other Ambulatory Visit: Payer: Self-pay | Admitting: Family Medicine

## 2021-11-16 DIAGNOSIS — Z1231 Encounter for screening mammogram for malignant neoplasm of breast: Secondary | ICD-10-CM

## 2021-12-27 ENCOUNTER — Ambulatory Visit
Admission: RE | Admit: 2021-12-27 | Discharge: 2021-12-27 | Disposition: A | Discharge status: Home / Self Care | Payer: Medicare PPO | Source: Ambulatory Visit | Attending: Family Medicine | Admitting: Family Medicine

## 2021-12-27 DIAGNOSIS — Z1231 Encounter for screening mammogram for malignant neoplasm of breast: Secondary | ICD-10-CM

## 2021-12-29 DIAGNOSIS — M25551 Pain in right hip: Secondary | ICD-10-CM | POA: Diagnosis not present

## 2021-12-29 DIAGNOSIS — M5431 Sciatica, right side: Secondary | ICD-10-CM | POA: Diagnosis not present

## 2021-12-30 DIAGNOSIS — R899 Unspecified abnormal finding in specimens from other organs, systems and tissues: Secondary | ICD-10-CM | POA: Diagnosis not present

## 2022-01-10 DIAGNOSIS — M25551 Pain in right hip: Secondary | ICD-10-CM | POA: Diagnosis not present

## 2022-01-10 DIAGNOSIS — M545 Low back pain, unspecified: Secondary | ICD-10-CM | POA: Diagnosis not present

## 2022-01-18 DIAGNOSIS — M25551 Pain in right hip: Secondary | ICD-10-CM | POA: Diagnosis not present

## 2022-01-18 DIAGNOSIS — M545 Low back pain, unspecified: Secondary | ICD-10-CM | POA: Diagnosis not present

## 2022-01-20 DIAGNOSIS — R3989 Other symptoms and signs involving the genitourinary system: Secondary | ICD-10-CM | POA: Diagnosis not present

## 2022-01-20 DIAGNOSIS — E1169 Type 2 diabetes mellitus with other specified complication: Secondary | ICD-10-CM | POA: Diagnosis not present

## 2022-02-07 DIAGNOSIS — E039 Hypothyroidism, unspecified: Secondary | ICD-10-CM | POA: Diagnosis not present

## 2022-02-21 ENCOUNTER — Other Ambulatory Visit: Payer: Self-pay | Admitting: Nurse Practitioner

## 2022-02-21 ENCOUNTER — Other Ambulatory Visit (HOSPITAL_COMMUNITY)
Admission: RE | Admit: 2022-02-21 | Discharge: 2022-02-21 | Disposition: A | Discharge status: Home / Self Care | Payer: Medicare PPO | Source: Ambulatory Visit | Attending: Nurse Practitioner | Admitting: Nurse Practitioner

## 2022-02-21 DIAGNOSIS — Z124 Encounter for screening for malignant neoplasm of cervix: Secondary | ICD-10-CM | POA: Insufficient documentation

## 2022-02-21 DIAGNOSIS — N812 Incomplete uterovaginal prolapse: Secondary | ICD-10-CM | POA: Diagnosis not present

## 2022-02-21 DIAGNOSIS — N870 Mild cervical dysplasia: Secondary | ICD-10-CM | POA: Diagnosis not present

## 2022-02-21 DIAGNOSIS — Z1151 Encounter for screening for human papillomavirus (HPV): Secondary | ICD-10-CM | POA: Insufficient documentation

## 2022-02-24 LAB — CYTOLOGY - PAP
Comment: NEGATIVE
Diagnosis: NEGATIVE
High risk HPV: NEGATIVE

## 2022-04-11 DIAGNOSIS — Z79899 Other long term (current) drug therapy: Secondary | ICD-10-CM | POA: Diagnosis not present

## 2022-04-11 DIAGNOSIS — E039 Hypothyroidism, unspecified: Secondary | ICD-10-CM | POA: Diagnosis not present

## 2022-04-11 DIAGNOSIS — Z23 Encounter for immunization: Secondary | ICD-10-CM | POA: Diagnosis not present

## 2022-04-11 DIAGNOSIS — J432 Centrilobular emphysema: Secondary | ICD-10-CM | POA: Diagnosis not present

## 2022-04-11 DIAGNOSIS — E7800 Pure hypercholesterolemia, unspecified: Secondary | ICD-10-CM | POA: Diagnosis not present

## 2022-04-11 DIAGNOSIS — I700 Atherosclerosis of aorta: Secondary | ICD-10-CM | POA: Diagnosis not present

## 2022-04-11 DIAGNOSIS — I10 Essential (primary) hypertension: Secondary | ICD-10-CM | POA: Diagnosis not present

## 2022-04-11 DIAGNOSIS — E559 Vitamin D deficiency, unspecified: Secondary | ICD-10-CM | POA: Diagnosis not present

## 2022-04-11 DIAGNOSIS — Z0001 Encounter for general adult medical examination with abnormal findings: Secondary | ICD-10-CM | POA: Diagnosis not present

## 2022-04-11 DIAGNOSIS — E1169 Type 2 diabetes mellitus with other specified complication: Secondary | ICD-10-CM | POA: Diagnosis not present

## 2022-04-12 ENCOUNTER — Other Ambulatory Visit: Payer: Self-pay | Admitting: Family Medicine

## 2022-04-12 DIAGNOSIS — E2839 Other primary ovarian failure: Secondary | ICD-10-CM

## 2022-05-10 ENCOUNTER — Ambulatory Visit
Admission: RE | Admit: 2022-05-10 | Discharge: 2022-05-10 | Disposition: A | Discharge status: Home / Self Care | Payer: Medicare PPO | Source: Ambulatory Visit | Attending: Family Medicine | Admitting: Family Medicine

## 2022-05-10 DIAGNOSIS — M85851 Other specified disorders of bone density and structure, right thigh: Secondary | ICD-10-CM | POA: Diagnosis not present

## 2022-05-10 DIAGNOSIS — E2839 Other primary ovarian failure: Secondary | ICD-10-CM

## 2022-05-10 DIAGNOSIS — Z780 Asymptomatic menopausal state: Secondary | ICD-10-CM | POA: Diagnosis not present

## 2022-05-22 ENCOUNTER — Ambulatory Visit
Admission: RE | Admit: 2022-05-22 | Discharge: 2022-05-22 | Disposition: A | Discharge status: Home / Self Care | Payer: Medicare PPO | Source: Ambulatory Visit | Attending: Family Medicine | Admitting: Family Medicine

## 2022-05-22 DIAGNOSIS — F17210 Nicotine dependence, cigarettes, uncomplicated: Secondary | ICD-10-CM | POA: Diagnosis not present

## 2022-05-22 DIAGNOSIS — Z87891 Personal history of nicotine dependence: Secondary | ICD-10-CM

## 2022-05-24 ENCOUNTER — Other Ambulatory Visit: Payer: Self-pay

## 2022-05-24 DIAGNOSIS — F17210 Nicotine dependence, cigarettes, uncomplicated: Secondary | ICD-10-CM

## 2022-05-24 DIAGNOSIS — Z87891 Personal history of nicotine dependence: Secondary | ICD-10-CM

## 2022-09-22 DIAGNOSIS — L821 Other seborrheic keratosis: Secondary | ICD-10-CM | POA: Diagnosis not present

## 2022-09-22 DIAGNOSIS — L728 Other follicular cysts of the skin and subcutaneous tissue: Secondary | ICD-10-CM | POA: Diagnosis not present

## 2022-09-22 DIAGNOSIS — D225 Melanocytic nevi of trunk: Secondary | ICD-10-CM | POA: Diagnosis not present

## 2022-10-17 DIAGNOSIS — E039 Hypothyroidism, unspecified: Secondary | ICD-10-CM | POA: Diagnosis not present

## 2022-10-24 DIAGNOSIS — Z85850 Personal history of malignant neoplasm of thyroid: Secondary | ICD-10-CM | POA: Diagnosis not present

## 2022-10-24 DIAGNOSIS — F17200 Nicotine dependence, unspecified, uncomplicated: Secondary | ICD-10-CM | POA: Diagnosis not present

## 2022-10-24 DIAGNOSIS — E039 Hypothyroidism, unspecified: Secondary | ICD-10-CM | POA: Diagnosis not present

## 2022-10-24 DIAGNOSIS — R7309 Other abnormal glucose: Secondary | ICD-10-CM | POA: Diagnosis not present

## 2022-10-24 DIAGNOSIS — M8580 Other specified disorders of bone density and structure, unspecified site: Secondary | ICD-10-CM | POA: Diagnosis not present

## 2022-12-05 DIAGNOSIS — E039 Hypothyroidism, unspecified: Secondary | ICD-10-CM | POA: Diagnosis not present

## 2022-12-05 DIAGNOSIS — R7309 Other abnormal glucose: Secondary | ICD-10-CM | POA: Diagnosis not present

## 2022-12-21 ENCOUNTER — Other Ambulatory Visit: Payer: Self-pay | Admitting: Family Medicine

## 2022-12-21 DIAGNOSIS — Z1231 Encounter for screening mammogram for malignant neoplasm of breast: Secondary | ICD-10-CM

## 2023-01-16 ENCOUNTER — Ambulatory Visit
Admission: RE | Admit: 2023-01-16 | Discharge: 2023-01-16 | Disposition: A | Discharge status: Home / Self Care | Payer: Medicare PPO | Source: Ambulatory Visit | Attending: Family Medicine | Admitting: Family Medicine

## 2023-01-16 DIAGNOSIS — Z1231 Encounter for screening mammogram for malignant neoplasm of breast: Secondary | ICD-10-CM | POA: Diagnosis not present

## 2023-02-27 DIAGNOSIS — Z87410 Personal history of cervical dysplasia: Secondary | ICD-10-CM | POA: Diagnosis not present

## 2023-02-27 DIAGNOSIS — Z01419 Encounter for gynecological examination (general) (routine) without abnormal findings: Secondary | ICD-10-CM | POA: Diagnosis not present

## 2023-02-27 DIAGNOSIS — N814 Uterovaginal prolapse, unspecified: Secondary | ICD-10-CM | POA: Diagnosis not present

## 2023-04-18 DIAGNOSIS — I10 Essential (primary) hypertension: Secondary | ICD-10-CM | POA: Diagnosis not present

## 2023-04-18 DIAGNOSIS — Z79899 Other long term (current) drug therapy: Secondary | ICD-10-CM | POA: Diagnosis not present

## 2023-04-18 DIAGNOSIS — E119 Type 2 diabetes mellitus without complications: Secondary | ICD-10-CM | POA: Diagnosis not present

## 2023-04-18 DIAGNOSIS — J069 Acute upper respiratory infection, unspecified: Secondary | ICD-10-CM | POA: Diagnosis not present

## 2023-04-18 DIAGNOSIS — Z23 Encounter for immunization: Secondary | ICD-10-CM | POA: Diagnosis not present

## 2023-04-18 DIAGNOSIS — Z0001 Encounter for general adult medical examination with abnormal findings: Secondary | ICD-10-CM | POA: Diagnosis not present

## 2023-04-18 DIAGNOSIS — Z1331 Encounter for screening for depression: Secondary | ICD-10-CM | POA: Diagnosis not present

## 2023-04-18 DIAGNOSIS — E7800 Pure hypercholesterolemia, unspecified: Secondary | ICD-10-CM | POA: Diagnosis not present

## 2023-04-18 DIAGNOSIS — Z03818 Encounter for observation for suspected exposure to other biological agents ruled out: Secondary | ICD-10-CM | POA: Diagnosis not present

## 2023-04-18 DIAGNOSIS — F17210 Nicotine dependence, cigarettes, uncomplicated: Secondary | ICD-10-CM | POA: Diagnosis not present

## 2023-04-26 DIAGNOSIS — E039 Hypothyroidism, unspecified: Secondary | ICD-10-CM | POA: Diagnosis not present

## 2023-05-03 DIAGNOSIS — E039 Hypothyroidism, unspecified: Secondary | ICD-10-CM | POA: Diagnosis not present

## 2023-05-03 DIAGNOSIS — E1165 Type 2 diabetes mellitus with hyperglycemia: Secondary | ICD-10-CM | POA: Diagnosis not present

## 2023-05-04 DIAGNOSIS — E7800 Pure hypercholesterolemia, unspecified: Secondary | ICD-10-CM | POA: Diagnosis not present

## 2023-05-24 ENCOUNTER — Inpatient Hospital Stay: Admission: RE | Admit: 2023-05-24 | Payer: Medicare PPO | Source: Ambulatory Visit

## 2023-08-02 DIAGNOSIS — E1165 Type 2 diabetes mellitus with hyperglycemia: Secondary | ICD-10-CM | POA: Diagnosis not present

## 2023-08-09 DIAGNOSIS — E785 Hyperlipidemia, unspecified: Secondary | ICD-10-CM | POA: Diagnosis not present

## 2023-08-09 DIAGNOSIS — E1165 Type 2 diabetes mellitus with hyperglycemia: Secondary | ICD-10-CM | POA: Diagnosis not present

## 2023-08-09 DIAGNOSIS — I10 Essential (primary) hypertension: Secondary | ICD-10-CM | POA: Diagnosis not present

## 2023-08-09 DIAGNOSIS — E039 Hypothyroidism, unspecified: Secondary | ICD-10-CM | POA: Diagnosis not present

## 2023-08-09 DIAGNOSIS — M8580 Other specified disorders of bone density and structure, unspecified site: Secondary | ICD-10-CM | POA: Diagnosis not present

## 2023-08-09 DIAGNOSIS — Z85850 Personal history of malignant neoplasm of thyroid: Secondary | ICD-10-CM | POA: Diagnosis not present

## 2023-11-08 DIAGNOSIS — E1165 Type 2 diabetes mellitus with hyperglycemia: Secondary | ICD-10-CM | POA: Diagnosis not present

## 2023-11-08 DIAGNOSIS — E039 Hypothyroidism, unspecified: Secondary | ICD-10-CM | POA: Diagnosis not present

## 2023-11-15 DIAGNOSIS — E039 Hypothyroidism, unspecified: Secondary | ICD-10-CM | POA: Diagnosis not present

## 2023-11-15 DIAGNOSIS — E1165 Type 2 diabetes mellitus with hyperglycemia: Secondary | ICD-10-CM | POA: Diagnosis not present

## 2023-11-15 DIAGNOSIS — E785 Hyperlipidemia, unspecified: Secondary | ICD-10-CM | POA: Diagnosis not present

## 2023-11-15 DIAGNOSIS — Z85850 Personal history of malignant neoplasm of thyroid: Secondary | ICD-10-CM | POA: Diagnosis not present

## 2023-11-15 DIAGNOSIS — I10 Essential (primary) hypertension: Secondary | ICD-10-CM | POA: Diagnosis not present

## 2023-12-07 IMAGING — CT CT CHEST LUNG CANCER SCREENING LOW DOSE W/O CM
2 of 4 series · 15 of 36 positions shown, 18 images · non-contrast
Comparison: Standard CT chest 12/31/2019

CLINICAL DATA: 75-year-old female with 25 pack-year history of
smoking. Lung cancer screening.



[Series 3: lung thins 1.0 · axial · 0.71mm/px · z∈[-304,-24]mm · 12 of 309 slices shown, 15 images]
[im 15/309  mediastinal]
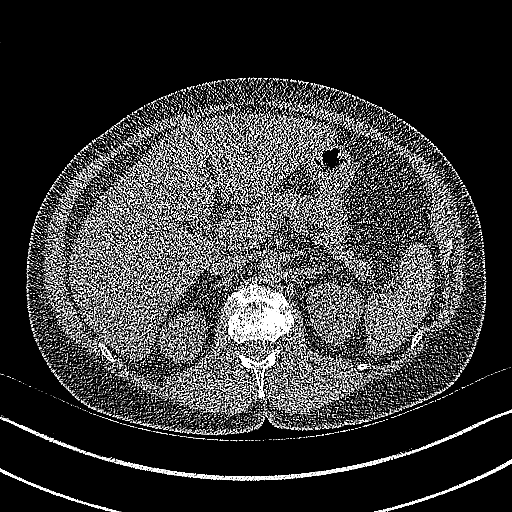
[im 15/309  lung]
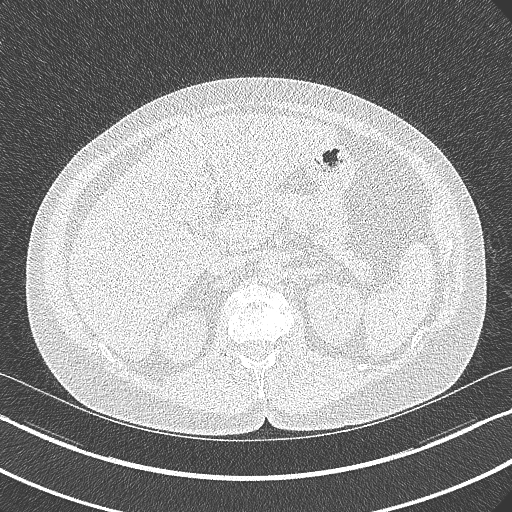
[im 43/309  lung]
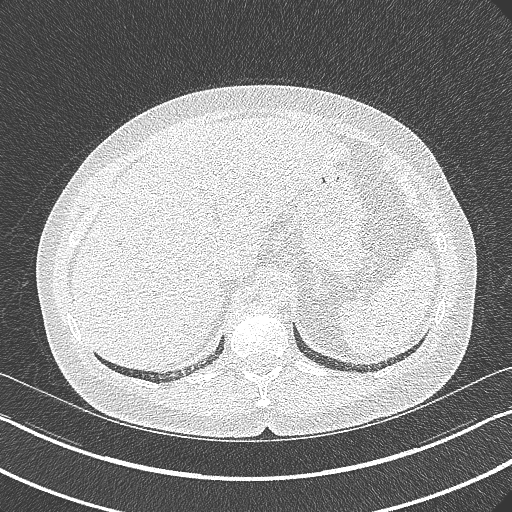
[im 71/309  lung]
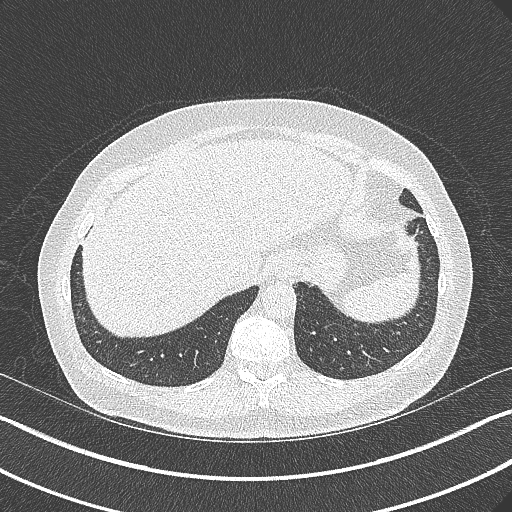
[im 99/309  lung]
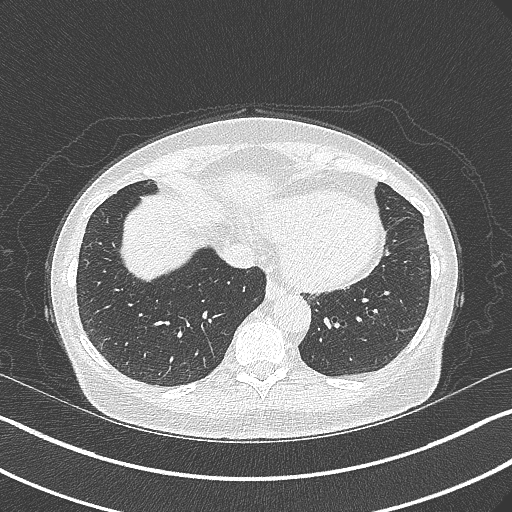
[im 113/309  mediastinal]
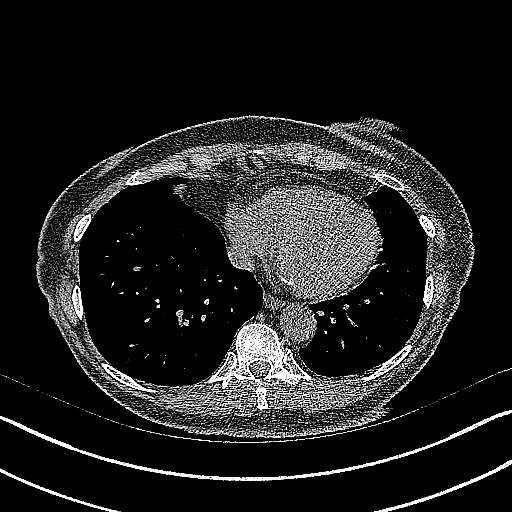
[im 113/309  lung]
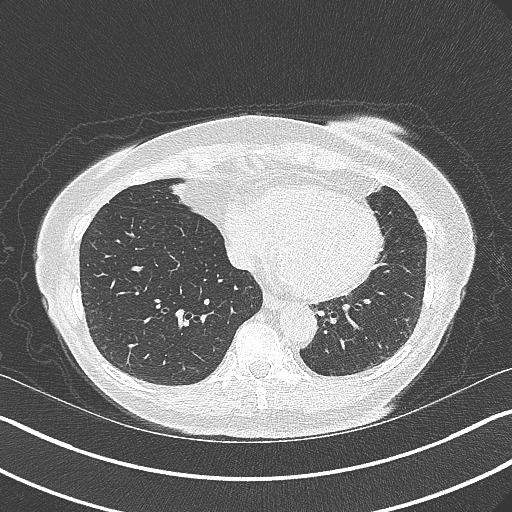
[im 141/309  lung]
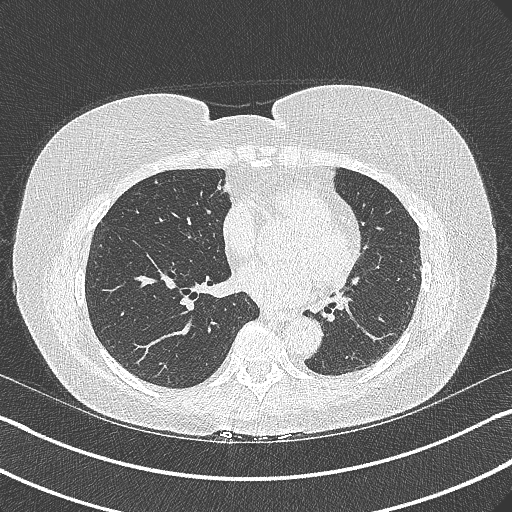
[im 169/309  lung]
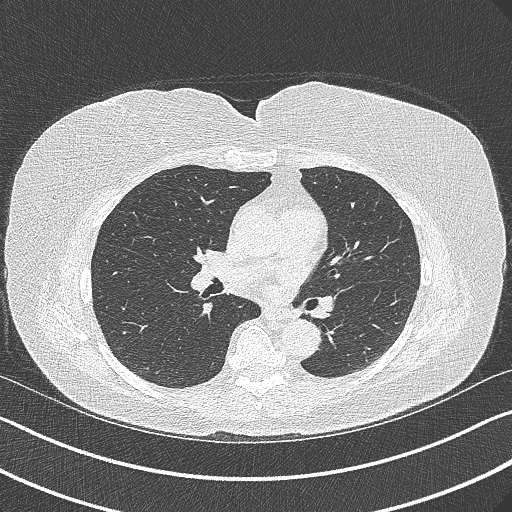
[im 197/309  lung]
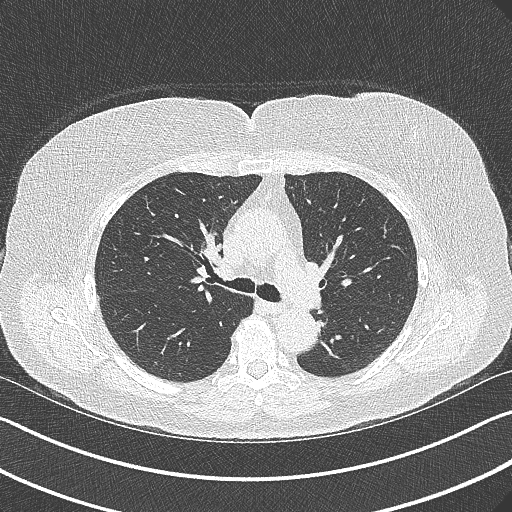
[im 211/309  mediastinal]
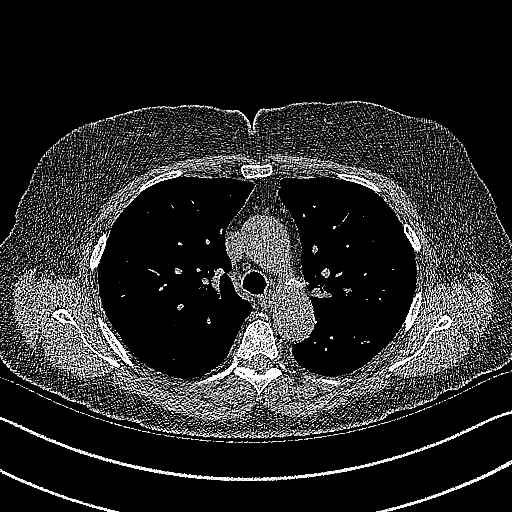
[im 211/309  lung]
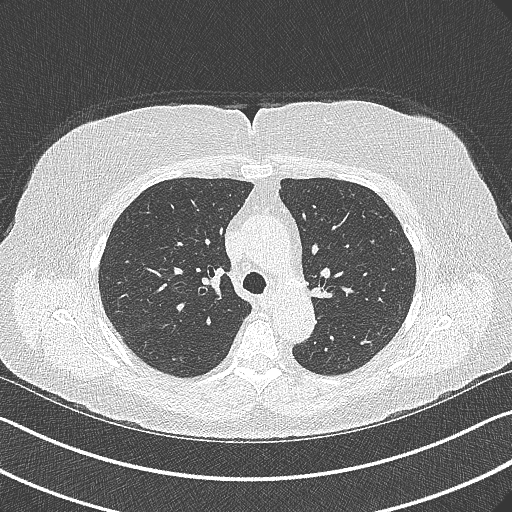
[im 239/309  lung]
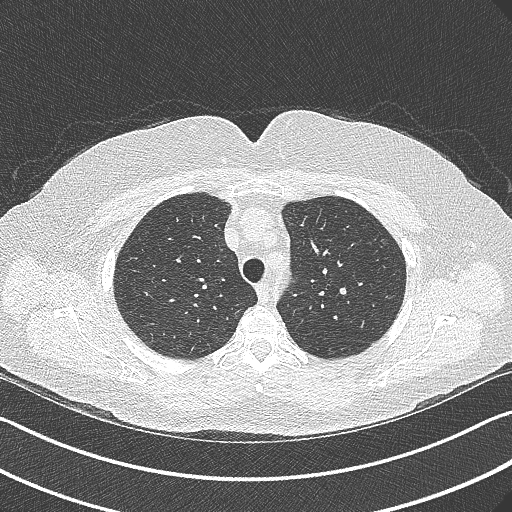
[im 267/309  lung]
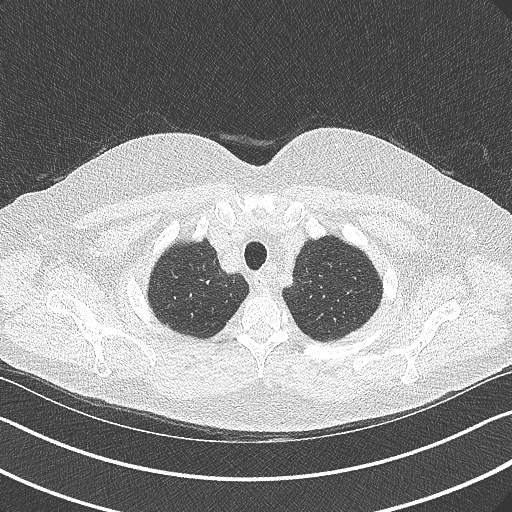
[im 295/309  lung]
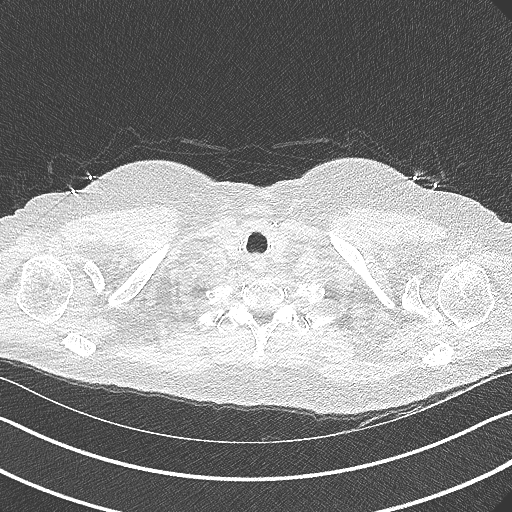

[Series 5: coronal · coronal · 0.59mm/px · 3 of 117 slices shown]
[im 24/117  lung]
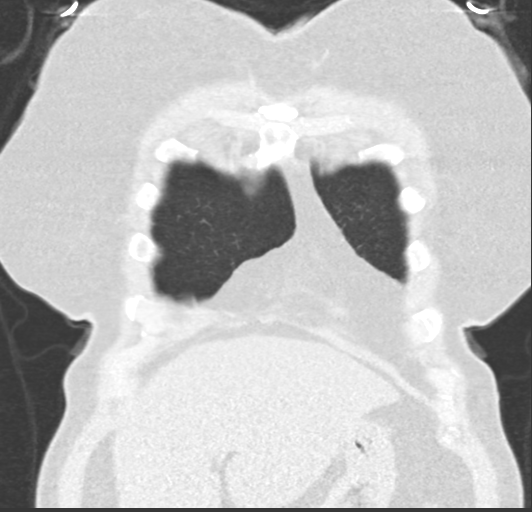
[im 47/117  lung]
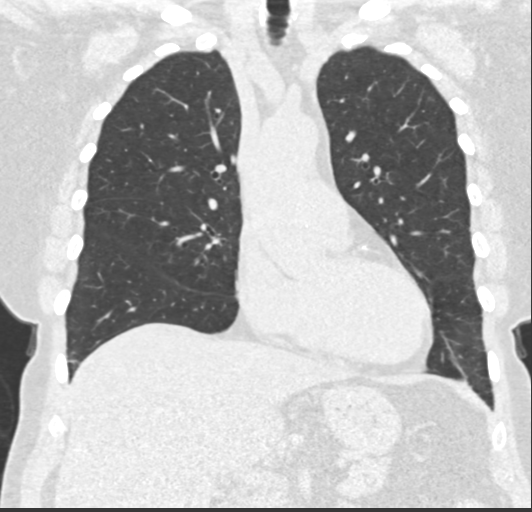
[im 70/117  lung]
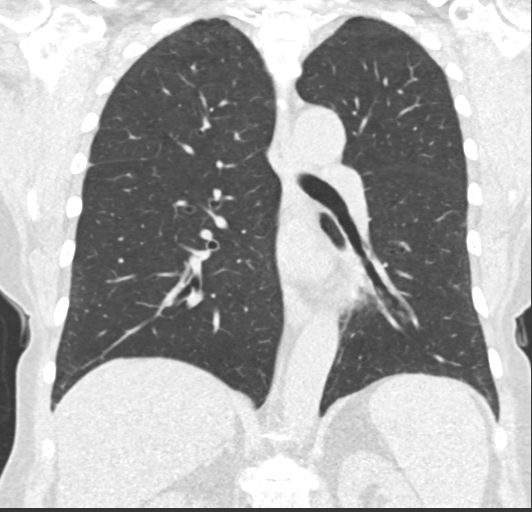

[15 of 36 positions shown; findings below may reference images not displayed]

FINDINGS: Cardiovascular: The heart size is normal. No substantial pericardial
effusion. Mild atherosclerotic calcification is noted in the wall of
the thoracic aorta.

Mediastinum/Nodes: No mediastinal lymphadenopathy. No evidence for
gross hilar lymphadenopathy although assessment is limited by the
lack of intravenous contrast on the current study. The esophagus has
normal imaging features. There is no axillary lymphadenopathy.

Lungs/Pleura: Centrilobular emphsyema noted. Tiny bilateral
pulmonary nodules are evident measuring up to maximum volume derived
equivalent diameter of 3 mm. There is no suspicious pulmonary nodule
or mass on today's exam. No focal airspace consolidation. No pleural
effusion.

Upper Abdomen: The liver shows diffusely decreased attenuation
suggesting fat deposition. Tiny hypodensity in the medial segment
left liver is stable, compatible with benign etiology such as a
cyst.

Musculoskeletal: No worrisome lytic or sclerotic osseous
abnormality.
IMPRESSION: 1. Lung-RADS 2, benign appearance or behavior. Continue annual
screening with low-dose chest CT without contrast in 12 months.
2. Hepatic steatosis.
3. Aortic Atherosclerosis (EGS5E-SRV.V) and Emphysema ([id number].C).

## 2023-12-10 DIAGNOSIS — I10 Essential (primary) hypertension: Secondary | ICD-10-CM | POA: Diagnosis not present

## 2023-12-10 DIAGNOSIS — Z86018 Personal history of other benign neoplasm: Secondary | ICD-10-CM | POA: Diagnosis not present

## 2023-12-12 ENCOUNTER — Other Ambulatory Visit: Payer: Self-pay | Admitting: Family Medicine

## 2023-12-12 DIAGNOSIS — Z1231 Encounter for screening mammogram for malignant neoplasm of breast: Secondary | ICD-10-CM

## 2024-01-04 DIAGNOSIS — Z09 Encounter for follow-up examination after completed treatment for conditions other than malignant neoplasm: Secondary | ICD-10-CM | POA: Diagnosis not present

## 2024-01-04 DIAGNOSIS — Z800 Family history of malignant neoplasm of digestive organs: Secondary | ICD-10-CM | POA: Diagnosis not present

## 2024-01-04 DIAGNOSIS — Z860101 Personal history of adenomatous and serrated colon polyps: Secondary | ICD-10-CM | POA: Diagnosis not present

## 2024-01-04 DIAGNOSIS — K635 Polyp of colon: Secondary | ICD-10-CM | POA: Diagnosis not present

## 2024-01-04 DIAGNOSIS — K5730 Diverticulosis of large intestine without perforation or abscess without bleeding: Secondary | ICD-10-CM | POA: Diagnosis not present

## 2024-01-08 DIAGNOSIS — K635 Polyp of colon: Secondary | ICD-10-CM | POA: Diagnosis not present

## 2024-01-17 ENCOUNTER — Ambulatory Visit
Admission: RE | Admit: 2024-01-17 | Discharge: 2024-01-17 | Disposition: A | Discharge status: Home / Self Care | Source: Ambulatory Visit | Attending: Family Medicine | Admitting: Family Medicine

## 2024-01-17 DIAGNOSIS — Z1231 Encounter for screening mammogram for malignant neoplasm of breast: Secondary | ICD-10-CM

## 2024-02-14 DIAGNOSIS — E1165 Type 2 diabetes mellitus with hyperglycemia: Secondary | ICD-10-CM | POA: Diagnosis not present

## 2024-03-12 ENCOUNTER — Other Ambulatory Visit: Payer: Self-pay | Admitting: Nurse Practitioner

## 2024-03-12 DIAGNOSIS — Z87410 Personal history of cervical dysplasia: Secondary | ICD-10-CM | POA: Diagnosis not present

## 2024-03-12 DIAGNOSIS — R102 Pelvic and perineal pain unspecified side: Secondary | ICD-10-CM | POA: Diagnosis not present

## 2024-03-12 DIAGNOSIS — Z01419 Encounter for gynecological examination (general) (routine) without abnormal findings: Secondary | ICD-10-CM | POA: Diagnosis not present

## 2024-04-01 ENCOUNTER — Ambulatory Visit
Admission: RE | Admit: 2024-04-01 | Discharge: 2024-04-01 | Disposition: A | Discharge status: Home / Self Care | Source: Ambulatory Visit | Attending: Nurse Practitioner | Admitting: Nurse Practitioner

## 2024-04-01 DIAGNOSIS — R102 Pelvic and perineal pain unspecified side: Secondary | ICD-10-CM
